# Patient Record
Sex: Female | Born: 1965 | Race: Black or African American | Hispanic: No | Marital: Single | Smoking: Never smoker
Health system: Southern US, Community
[De-identification: ages and names within clinical notes are randomized; demographics above are authoritative.]

## PROBLEM LIST (undated history)

## (undated) DIAGNOSIS — I10 Essential (primary) hypertension: Secondary | ICD-10-CM

## (undated) DIAGNOSIS — Z5189 Encounter for other specified aftercare: Secondary | ICD-10-CM

## (undated) DIAGNOSIS — Z923 Personal history of irradiation: Secondary | ICD-10-CM

## (undated) DIAGNOSIS — R011 Cardiac murmur, unspecified: Secondary | ICD-10-CM

## (undated) DIAGNOSIS — C50919 Malignant neoplasm of unspecified site of unspecified female breast: Secondary | ICD-10-CM

## (undated) HISTORY — DX: Encounter for other specified aftercare: Z51.89

## (undated) HISTORY — DX: Malignant neoplasm of unspecified site of unspecified female breast: C50.919

## (undated) HISTORY — DX: Essential (primary) hypertension: I10

## (undated) HISTORY — PX: PELVIC LAPAROSCOPY: SHX162

## (undated) HISTORY — DX: Cardiac murmur, unspecified: R01.1

## (undated) HISTORY — PX: ABDOMINAL HYSTERECTOMY: SHX81

---

## 1999-06-26 ENCOUNTER — Other Ambulatory Visit: Admission: RE | Admit: 1999-06-26 | Discharge: 1999-06-26 | Payer: Self-pay | Admitting: Obstetrics & Gynecology

## 2000-08-13 ENCOUNTER — Other Ambulatory Visit: Admission: RE | Admit: 2000-08-13 | Discharge: 2000-08-13 | Payer: Self-pay | Admitting: Obstetrics & Gynecology

## 2000-10-01 ENCOUNTER — Encounter (INDEPENDENT_AMBULATORY_CARE_PROVIDER_SITE_OTHER): Payer: Self-pay

## 2000-10-01 ENCOUNTER — Ambulatory Visit (HOSPITAL_COMMUNITY): Admission: RE | Admit: 2000-10-01 | Discharge: 2000-10-01 | Payer: Self-pay | Admitting: Obstetrics & Gynecology

## 2000-11-06 ENCOUNTER — Other Ambulatory Visit: Admission: RE | Admit: 2000-11-06 | Discharge: 2000-11-06 | Payer: Self-pay | Admitting: *Deleted

## 2000-11-12 ENCOUNTER — Encounter: Payer: Self-pay | Admitting: Obstetrics & Gynecology

## 2000-11-12 ENCOUNTER — Ambulatory Visit (HOSPITAL_COMMUNITY): Admission: RE | Admit: 2000-11-12 | Discharge: 2000-11-12 | Payer: Self-pay | Admitting: Obstetrics & Gynecology

## 2002-03-15 ENCOUNTER — Other Ambulatory Visit: Admission: RE | Admit: 2002-03-15 | Discharge: 2002-03-15 | Payer: Self-pay | Admitting: Obstetrics & Gynecology

## 2003-03-15 ENCOUNTER — Other Ambulatory Visit: Admission: RE | Admit: 2003-03-15 | Discharge: 2003-03-15 | Payer: Self-pay | Admitting: Obstetrics & Gynecology

## 2003-06-26 ENCOUNTER — Observation Stay (HOSPITAL_COMMUNITY): Admission: AD | Admit: 2003-06-26 | Discharge: 2003-06-26 | Payer: Self-pay | Admitting: Obstetrics and Gynecology

## 2003-08-28 ENCOUNTER — Encounter (INDEPENDENT_AMBULATORY_CARE_PROVIDER_SITE_OTHER): Payer: Self-pay | Admitting: Specialist

## 2003-08-29 ENCOUNTER — Inpatient Hospital Stay (HOSPITAL_COMMUNITY): Admission: RE | Admit: 2003-08-29 | Discharge: 2003-08-31 | Payer: Self-pay | Admitting: Obstetrics & Gynecology

## 2004-01-26 ENCOUNTER — Ambulatory Visit (HOSPITAL_COMMUNITY): Admission: RE | Admit: 2004-01-26 | Discharge: 2004-01-26 | Payer: Self-pay | Admitting: Infectious Diseases

## 2009-04-04 ENCOUNTER — Ambulatory Visit (HOSPITAL_COMMUNITY): Admission: RE | Admit: 2009-04-04 | Discharge: 2009-04-04 | Payer: Self-pay | Admitting: Obstetrics & Gynecology

## 2010-04-05 ENCOUNTER — Ambulatory Visit (HOSPITAL_COMMUNITY): Admission: RE | Admit: 2010-04-05 | Discharge: 2010-04-05 | Payer: Self-pay | Admitting: Obstetrics & Gynecology

## 2010-10-25 NOTE — Op Note (Signed)
[facility]  Patient:    Erin Bowers, Erin Bowers                          MRN: [id number] Proc. Date: 10/01/00 Adm. Date:  [id number] Attending:  Genia Del                           Operative Report  PREOPERATIVE DIAGNOSES:       1. Menorrhagia.                               2. Secondary anemia.                               3. Intrauterine lesion, possible myoma                                  or polyp.  POSTOPERATIVE DIAGNOSES:      1. Menorrhagia.                               2. Secondary anemia.                               3. Intrauterine lesion, possible myoma                                  or polyp.                               4. Confirmed submucosal and intramural                                  myoma.                               5. Multiple endometrial polyps.  INTERVENTION:                 Hysteroscopy with myomectomy and polypectomy. Also dilatation and curettage.  SURGEON:                      Genia Del, M.D.  ANESTHESIOLOGIST:             Ellison Hughs., M.D.  PROCEDURE:                    Under general anesthesia with endotracheal intubation, the patient is in the lithotomy position.  She is prepped with Betadine in the suprapubic, vulva and vaginal areas.  The bladder is catheterized and the patient is draped as usual.  The vaginal examination under general anesthesia reveals an anteverted uterus, irregular and slightly increased in volume -- corresponding to about [redacted] weeks gestation.  No adnexal mass.  The uterus is mobile.  Cervix is normal to palpation.  The speculum is inserted.  The anterior lip of the cervix is grasped.  We then proceeded to dilate the patient with  Hegar dilators up to #31 easily.  We then inserted the abortive hysteroscope and inspect the intrauterine cavity.  We noticed multiple polyps, especially on the lateral left side of the uterus on the anterior wall and posterior wall.  A  large intramural myoma is present on the anterior right aspect of the uterus, and is bulging into the uterine cavity. A smaller myoma, about 20 cm in diameter, is present on the left posterior wall.  At this point is submucosal.  We used the double loop to extract all the endometrial polyps and the submucosal myoma.  The larger intramural myoma is shaped progressively up to the point where the uterine cavity is regular. The left ostium is well seen.  The right ostium is not as well seen because of this anterior right myoma.  We obtained hemostasis with coagulation with the loop.  Pictures are taken before and after the procedure.  Hemostasis is adequate.  We proceed to curettage with a sharp curet.  Both specimens from myomas and polyps, as well as the endometrial curettage are sent to pathology. We look once more with the hysteroscope, complete hemostasis once more.  It is then completely satisfactory.  The instruments are removed.  ESTIMATED BLOOD LOSS:         50 cc.  FLUID LOSS:                   175 cc.  COMPLICATIONS:                None occurred.  DISPOSITION:                  The patient was transferred to recovery room in good condition. DD:  10/01/00 TD:  10/02/00 Job: [id number]

## 2010-10-25 NOTE — Discharge Summary (Signed)
NAME:  Erin Bowers, Erin Bowers                           ACCOUNT NO.:  [id number]   MEDICAL RECORD NO.:  [id number]                   PATIENT TYPE:  INP   LOCATION:  9319                                 FACILITY:  [facility]   PHYSICIAN:  Genia Del, M.D.             DATE OF BIRTH:  04/12/1966   DATE OF ADMISSION:  08/28/2003  DATE OF DISCHARGE:  08/31/2003                                 DISCHARGE SUMMARY   ADMISSION DIAGNOSES:  Severe menorrhagia with secondary severe anemia with  myomatous uterus.   DISCHARGE DIAGNOSES:  1. Severe menorrhagia with secondary severe anemia with myomatous uterus.  2. Endometriosis.   PATHOLOGY REPORT:  Extensive adenomyosis, subserosal and intramural  leiomyomata.   INTERVENTION:  On August 28, 2003 - opened diagnostic laparoscopy and total  abdominal hysterectomy with cauterization of left ovarian endometrioma.   HOSPITAL COURSE:  The patient had surgery on August 28, 2003.  No  complications occurred.  Her postoperative course was unremarkable.  She  remained afebrile and hemodynamically stable.  Her hemoglobin went from 10.5  preoperatively in a slightly dehydrated state to 8.6 postoperatively.  She  was discharged on postoperative day #3 - August 31, 2003 - in good status.  Postoperative advices were given.  The patient was prescribed iron sulfate  b.i.d. for 2 months, Tylox p.r.n. for pain, and she will follow up at  [facility] office in 4 weeks.                                               Genia Del, M.D.    ML/MEDQ  D:  09/26/2003  T:  09/27/2003  Job:  [id number]

## 2010-10-25 NOTE — H&P (Signed)
NAME:  Erin Bowers, Erin Bowers                           ACCOUNT NO.:  [id number]   MEDICAL RECORD NO.:  [id number]                   PATIENT TYPE:  AMB   LOCATION:  SDC                                  [facility]ILITY[facility]  [facility]   PHYSICIAN:  Genia Del, M.D.             DATE OF BIRTH:  [birth year]   DATE OF ADMISSION:  08/28/2003  DATE OF DISCHARGE:                                HISTORY & PHYSICAL   Ms. Erin Bowers is a 45 year old G0.   REASON FOR ADMISSION:  Severe menorrhagia with anemia requiring blood  transfusion, and myomatous uterus.   HISTORY OF PRESENT ILLNESS:  The patient had severe long-standing severe  menorrhagia with secondary anemia.  She has attempted pregnancy without  success in the past but now she does not want to preserve her fertility.  She had a hysteroscopy with myomectomy and polypectomy with dilation and  curettage by myself on October 01, 2000.  The pathology at that time was  benign, showing secretory endometrium, benign polyp, and submucosal  leiomyoma.  Since that surgery her bleeding has been worsening progressively  to the point where her hemoglobin was at 5.5 on June 26, 2003, at which  time she went for two units of packed red blood cells transfusion.  She was  also started on Lupron Depot at that time.  It took until past the second  injection before the bleeding stopped.  She was also using Aygestin during  that period.  She has received a third dose of Lupron Depot on August 11, 2003, and is currently not bleeding and has just slight cramping.  Her last  hemoglobin on August 22, 2003, was at 10.   PAST MEDICAL HISTORY:  Otherwise negative.   PAST SURGICAL HISTORY:  Left salpingectomy, right tuboplasty by laparotomy,  1997 and 1998.  Diagnostic laparoscopy just before those surgeries.   No known drug allergies.   CURRENT MEDICATION:  Iron supplement.   SOCIAL HISTORY:  Nonsmoker.  Married, currently separating.   REVIEW OF SYSTEMS:  CONSTITUTIONAL:   Negative.  HEENT:  Negative.  CARDIOVASCULAR:  Negative. RESPIRATORY:  Negative.  GASTROINTESTINAL:  Negative.  UROLOGIC:  Negative.  NEUROLOGIC:  Negative.  ENDOCRINOLOGIC:  Negative.  DERMATOLOGIC:  Negative.   PHYSICAL EXAMINATION:  GENERAL:  No apparent distress.  VITAL SIGNS:  Blood pressure 114/70, pulse regular, respiratory rate 20,  temperature normal.  Weight 155.4 pounds.  CHEST:  Lungs clear bilaterally.  CARDIAC:  Regular cardiac rhythm, no murmur.  ABDOMEN:  Soft, nontender.  PELVIC:  Vaginal exam:  Uterus anteverted, about 10 cm, irregular, mobile.  No adnexal mass.  Cervix nontender to mobilization.  No bleeding.  Last Pap  test October 2004 within normal limits.  EXTREMITIES:  Lower limbs normal, good pulses.   Hemoglobin on August 22, 2003, increased to 10.   IMPRESSION:  Severe longstanding menorrhagia with severe anemia requiring  blood transfusion in January 2005,  on a myomatous uterus, which responded to  Lupron Depot x3 injections.  The patient does not want to preserve her  conception potential.   PLAN:  Admit to [facility] for a laparoscopy-assisted vaginal  hysterectomy, possible laparotomy, preservation of ovaries.  Surgery and  risks reviewed with the patient, including infection, hemorrhage up to the  point of requiring blood transfusion, trauma to bowels, blood vessels,  ureters, bladder, DVT, pulmonary embolism, and anesthesiology complications.  Prep for bowels prescribed.  Will give a dose of Ancef at induction.  The  patient agrees with plan and voiced understanding.                                               Genia Del, M.D.    ML/MEDQ  D:  08/27/2003  T:  08/28/2003  Job:  [id number]

## 2010-10-25 NOTE — Op Note (Signed)
NAME:  Erin Bowers, FRIESENHAHN                           ACCOUNT NO.:  [id number]   MEDICAL RECORD NO.:  [id number]                   PATIENT TYPE:  OBV   LOCATION:  9319                                 [facility]ACILITY:  [facility]   PHYSICIAN:  Genia Del, M.D.             DATE OF BIRTH:  09-01-1965   DATE OF PROCEDURE:  08/28/2003  DATE OF DISCHARGE:                                 OPERATIVE REPORT   PREOPERATIVE DIAGNOSIS:  Severe menorrhagia with secondary severe anemia and  myomatous uterus.   POSTOPERATIVE DIAGNOSES:  1. Severe menorrhagia with secondary severe anemia and myomatous uterus.  2. Mild pelvic endometriosis.  3. Uterine size corresponding to 14-15 weeks' size.   INTERVENTION:  1. Open diagnostic laparoscopy.  2. Total abdominal hysterectomy.  3. Cauterization of left endometrioma.   SURGEON:  Genia Del, M.D.   ASSISTANT:  Maxie Better, M.D.   ANESTHESIOLOGIST:  Burnett Corrente, M.D.   PROCEDURE:  Under general anesthesia with endotracheal intubation, the  patient is in lithotomy position for operative laparoscopy.  She is prepped  with Hibiclens on the abdominal, suprapubic, vulvar, and vaginal area.  The  bladder catheter is inserted and the patient is draped as usual.  The  vaginal exam reveals a uterus estimated to 12-13 weeks' size, mobile, no  adnexal mass.  The speculum is inserted.  The uterus is cannulated, and the  speculum is removed.  We then go to abdominal time.  An infraumbilical  incision is made with the scalpel over 2 cm.  We then open layer by layer  with Mayo scissors.  The intra-abdominal cavity is entered under direct  vision.  We put a suture of Vicryl 0 at the aponeurosis in a pursestring  fashion.  We then insert the trocar with the laparoscope at that level.  The  abdominal cavity is inspected, a picture is taken of the appendix, which  appears normal.  The liver appears normal.  No pathology is seen in the  abdomen.  In the pelvic  cavity the uterus is taking all the pelvic area and  coming up above the pelvic brim.  Visualization of the right ovary is not  possible.  We put a second trocar in the suprapubic area at the level of her  previous scar.  We make an incision over 5 mm with a scalpel, insert a 5 mm  trocar and an atraumatic clamp at that level.  Mobilization of the uterus to  access either side of the uterus is not possible.  The uterus is too  voluminous to proceed with LAVH.  The decision is therefore taken to remove  the instrument, evacuate the CO2, and proceed with total abdominal  hysterectomy via a Pfannenstiel incision at the site of the previous scar.  We attach the suture at the aponeurosis at the infraumbilical area.  We then  make a Pfannenstiel incision at the site of the  previous scar with the  scalpel.  Infiltration of the subcutaneous tissue was done at the  infraumbilical incision as well as at the Pfannenstiel incision with  Marcaine 0.25% plain, a total of about 15 mL.  We open the adipose tissue.  We then open the aponeurosis with the scalpel and then with the Mayo  scissors transversely.  We separate the recti muscles from the aponeurosis  on the midline superiorly and inferiorly.  The parietal peritoneum is opened  longitudinally with Metzenbaum scissors.  We then retract the bladder  downward.  We are able to bring the uterus outside the abdominal cavity and  can start the surgery in that position.  We use retractors at this  __________.  We suture the right round ligament with a Vicryl 0.  We  cauterize and cut with the electrocautery.  We then open the parietal  peritoneum anteriorly with Metzenbaum scissors and retract the bladder  downward.  We proceed the same way on the left side, suturing the left round  ligament with a Vicryl 0, cauterizing and cutting with the electrocautery,  and then finishing the opening of the visceral peritoneum anteriorly.  We  bring the bladder even  lower down at the junction of the cervix and vagina.  We then make a window in the broad ligament on the right side.  We clamp the  tube and the utero-ovarian ligament.  We section with Mayo scissors and  suture with a 0 Vicryl stitch and then a free tie.  We proceed the same way  on the left side.  We then attempt to visualize the ureters, but it is not  possible with the uterus in place.  We can feel them in normal anatomic  location.  We then continue clamping the right uterine artery, sectioning  and securing with a 0 Vicryl.  The suture is doubled.  We proceed the same  way on the left side.  We then reach the uterosacral ligaments.  They are  taken separately with a curved Heaney, clamped, cut with Mayo scissors, and  sutured with a 0 Vicryl that is kept on a hemostat on each side.  We then  continue down the uterus, reaching the angle of the vagina.  A curved Heaney  is used on either side at that level.  We clamp, cut, with Mayo scissors,  and suture with a Vicryl 0 in a Heaney stitch that is kept on a straight  hemostat on each side.  The uterus is removed in one piece and sent to  pathology.  The cervix is intact.  We then complete the closure of the  vagina with one stitch in the midline.  An X stitch is done with Vicryl  0.  We then proceed with suspension.  The uterosacral ligament is attached to  the angle of the vagina on its respective side and an additional stitch is  used to secure that attachment on each side.  We verify hemostasis, which is  adequate at all levels, at all pedicles.  The ureters are visualized and  moving on each side.  The urine is clear, although minimal amounts are  produced probably due to relative dehydration of the patient, who mentioned  before the surgery that she was fasting over the weekend.  We had put a  retractor with three laps after removing the uterus.  Those are all removed after irrigation and suction of the abdominopelvic cavity.  We  reposition  the bowels with  the omentum properly.  We then verify hemostasis on the  recti muscles and aponeurosis.  It is completed with the electrocautery.  We  then close the aponeurosis with two half running sutures of Vicryl 0.  We  then verify hemostasis on the adipose tissue and complete it with the  electrocautery.  We then reapproximate the skin with staples.  The incision  at the infraumbilical area is closed at the skin with a 4-0 Monocryl.  The  count of sponges and instruments was complete x2.  The estimated blood loss  was 250 mL.  No complication occurred, and the patient was transferred to  the recovery room in good status.  Note that she received Ancef 1 g IV  before the procedure and had PAS device during the surgery.                                               Genia Del, M.D.    ML/MEDQ  D:  08/28/2003  T:  08/29/2003  Job:  [id number]

## 2011-03-12 ENCOUNTER — Other Ambulatory Visit (HOSPITAL_COMMUNITY): Payer: Self-pay | Admitting: Obstetrics & Gynecology

## 2011-03-12 DIAGNOSIS — Z1231 Encounter for screening mammogram for malignant neoplasm of breast: Secondary | ICD-10-CM

## 2011-04-14 ENCOUNTER — Ambulatory Visit (HOSPITAL_COMMUNITY): Payer: Self-pay

## 2011-04-15 ENCOUNTER — Ambulatory Visit (HOSPITAL_COMMUNITY)
Admission: RE | Admit: 2011-04-15 | Discharge: 2011-04-15 | Disposition: A | Discharge status: Home / Self Care | Payer: 59 | Source: Ambulatory Visit | Attending: Obstetrics & Gynecology | Admitting: Obstetrics & Gynecology

## 2011-04-15 DIAGNOSIS — Z1231 Encounter for screening mammogram for malignant neoplasm of breast: Secondary | ICD-10-CM | POA: Insufficient documentation

## 2012-04-07 ENCOUNTER — Other Ambulatory Visit (HOSPITAL_COMMUNITY): Payer: Self-pay | Admitting: Obstetrics & Gynecology

## 2012-04-07 DIAGNOSIS — Z1231 Encounter for screening mammogram for malignant neoplasm of breast: Secondary | ICD-10-CM

## 2012-04-23 ENCOUNTER — Ambulatory Visit (HOSPITAL_COMMUNITY)
Admission: RE | Admit: 2012-04-23 | Discharge: 2012-04-23 | Disposition: A | Discharge status: Home / Self Care | Payer: 59 | Source: Ambulatory Visit | Attending: Obstetrics & Gynecology | Admitting: Obstetrics & Gynecology

## 2012-04-23 DIAGNOSIS — Z1231 Encounter for screening mammogram for malignant neoplasm of breast: Secondary | ICD-10-CM | POA: Insufficient documentation

## 2013-04-21 ENCOUNTER — Other Ambulatory Visit (HOSPITAL_COMMUNITY): Payer: Self-pay | Admitting: Obstetrics & Gynecology

## 2013-04-21 DIAGNOSIS — Z1231 Encounter for screening mammogram for malignant neoplasm of breast: Secondary | ICD-10-CM

## 2013-05-10 ENCOUNTER — Ambulatory Visit (HOSPITAL_COMMUNITY)
Admission: RE | Admit: 2013-05-10 | Discharge: 2013-05-10 | Disposition: A | Discharge status: Home / Self Care | Payer: 59 | Source: Ambulatory Visit | Attending: Obstetrics & Gynecology | Admitting: Obstetrics & Gynecology

## 2013-05-10 DIAGNOSIS — Z1231 Encounter for screening mammogram for malignant neoplasm of breast: Secondary | ICD-10-CM | POA: Insufficient documentation

## 2015-03-29 ENCOUNTER — Other Ambulatory Visit: Payer: Self-pay

## 2015-03-29 DIAGNOSIS — Z1231 Encounter for screening mammogram for malignant neoplasm of breast: Secondary | ICD-10-CM

## 2015-04-27 ENCOUNTER — Ambulatory Visit
Admission: RE | Admit: 2015-04-27 | Discharge: 2015-04-27 | Disposition: A | Discharge status: Home / Self Care | Payer: 59 | Source: Ambulatory Visit

## 2015-04-27 DIAGNOSIS — Z1231 Encounter for screening mammogram for malignant neoplasm of breast: Secondary | ICD-10-CM

## 2015-05-01 ENCOUNTER — Other Ambulatory Visit: Payer: Self-pay | Admitting: Family

## 2015-05-01 DIAGNOSIS — R928 Other abnormal and inconclusive findings on diagnostic imaging of breast: Secondary | ICD-10-CM

## 2015-05-10 ENCOUNTER — Other Ambulatory Visit: Payer: Self-pay | Admitting: *Deleted

## 2015-05-10 ENCOUNTER — Other Ambulatory Visit: Payer: Self-pay | Admitting: Family

## 2015-05-10 ENCOUNTER — Other Ambulatory Visit: Payer: Self-pay

## 2015-05-10 DIAGNOSIS — R928 Other abnormal and inconclusive findings on diagnostic imaging of breast: Secondary | ICD-10-CM

## 2015-05-31 ENCOUNTER — Ambulatory Visit
Admission: RE | Admit: 2015-05-31 | Discharge: 2015-05-31 | Disposition: A | Discharge status: Home / Self Care | Payer: 59 | Source: Ambulatory Visit | Attending: Family | Admitting: Family

## 2015-05-31 DIAGNOSIS — R928 Other abnormal and inconclusive findings on diagnostic imaging of breast: Secondary | ICD-10-CM

## 2015-06-10 DIAGNOSIS — Z923 Personal history of irradiation: Secondary | ICD-10-CM

## 2015-06-10 HISTORY — PX: BREAST LUMPECTOMY: SHX2

## 2015-06-10 HISTORY — DX: Personal history of irradiation: Z92.3

## 2015-06-13 MED FILL — OLMESARTAN-HCTZ 20-12.5 MG: 20-12.5 | 30 days supply | Qty: 30 | Fill #1

## 2015-06-25 ENCOUNTER — Ambulatory Visit
Admission: RE | Admit: 2015-06-25 | Discharge: 2015-06-25 | Disposition: A | Discharge status: Home / Self Care | Payer: 59 | Source: Ambulatory Visit | Attending: Family | Admitting: Family

## 2015-06-25 ENCOUNTER — Other Ambulatory Visit: Payer: Self-pay | Admitting: Family

## 2015-06-25 DIAGNOSIS — R928 Other abnormal and inconclusive findings on diagnostic imaging of breast: Secondary | ICD-10-CM

## 2015-06-25 DIAGNOSIS — D0512 Intraductal carcinoma in situ of left breast: Secondary | ICD-10-CM | POA: Diagnosis not present

## 2015-06-25 DIAGNOSIS — R921 Mammographic calcification found on diagnostic imaging of breast: Secondary | ICD-10-CM | POA: Diagnosis not present

## 2015-06-26 ENCOUNTER — Ambulatory Visit
Admission: RE | Admit: 2015-06-26 | Discharge: 2015-06-26 | Disposition: A | Discharge status: Home / Self Care | Payer: 59 | Source: Ambulatory Visit | Attending: Family | Admitting: Family

## 2015-06-26 DIAGNOSIS — R928 Other abnormal and inconclusive findings on diagnostic imaging of breast: Secondary | ICD-10-CM

## 2015-06-26 DIAGNOSIS — D0512 Intraductal carcinoma in situ of left breast: Secondary | ICD-10-CM | POA: Diagnosis not present

## 2015-06-27 ENCOUNTER — Telehealth: Payer: Self-pay | Admitting: *Deleted

## 2015-06-27 DIAGNOSIS — C50212 Malignant neoplasm of upper-inner quadrant of left female breast: Secondary | ICD-10-CM | POA: Insufficient documentation

## 2015-06-27 NOTE — Telephone Encounter (Signed)
Left message for a return phone call to schedule for [facility]. 

## 2015-06-27 NOTE — Telephone Encounter (Signed)
Received call back from patient.Confirmed [facility] for 07/04/15 at 830am .  Instructions and contact information given.

## 2015-07-04 ENCOUNTER — Ambulatory Visit: Payer: 59 | Admitting: Physical Therapy

## 2015-07-04 ENCOUNTER — Ambulatory Visit (HOSPITAL_BASED_OUTPATIENT_CLINIC_OR_DEPARTMENT_OTHER): Payer: 59 | Admitting: Hematology and Oncology

## 2015-07-04 ENCOUNTER — Other Ambulatory Visit: Payer: Self-pay | Admitting: General Surgery

## 2015-07-04 ENCOUNTER — Encounter: Payer: Self-pay | Admitting: Hematology and Oncology

## 2015-07-04 ENCOUNTER — Encounter: Payer: Self-pay | Admitting: Skilled Nursing Facility1

## 2015-07-04 ENCOUNTER — Other Ambulatory Visit (HOSPITAL_BASED_OUTPATIENT_CLINIC_OR_DEPARTMENT_OTHER): Payer: 59

## 2015-07-04 ENCOUNTER — Encounter: Payer: Self-pay | Admitting: Nurse Practitioner

## 2015-07-04 ENCOUNTER — Ambulatory Visit
Admission: RE | Admit: 2015-07-04 | Discharge: 2015-07-04 | Disposition: A | Discharge status: Home / Self Care | Payer: 59 | Source: Ambulatory Visit | Attending: Radiation Oncology | Admitting: Radiation Oncology

## 2015-07-04 VITALS — BP 130/86 | HR 73 | Temp 98.3°F | Resp 18 | Ht 64.0 in | Wt 183.1 lb

## 2015-07-04 DIAGNOSIS — D0512 Intraductal carcinoma in situ of left breast: Secondary | ICD-10-CM | POA: Diagnosis not present

## 2015-07-04 DIAGNOSIS — C50212 Malignant neoplasm of upper-inner quadrant of left female breast: Secondary | ICD-10-CM

## 2015-07-04 LAB — COMPREHENSIVE METABOLIC PANEL
ALBUMIN: 4.2 g/dL (ref 3.5–5.0)
ALK PHOS: 73 U/L (ref 40–150)
ALT: 20 U/L (ref 0–55)
ANION GAP: 9 meq/L (ref 3–11)
AST: 19 U/L (ref 5–34)
BILIRUBIN TOTAL: 0.33 mg/dL (ref 0.20–1.20)
BUN: 15.9 mg/dL (ref 7.0–26.0)
CO2: 30 meq/L — AB (ref 22–29)
CREATININE: 0.8 mg/dL (ref 0.6–1.1)
Calcium: 10.1 mg/dL (ref 8.4–10.4)
Chloride: 104 mEq/L (ref 98–109)
EGFR: >90 mL/min/{1.73_m2} (ref >90)
GLUCOSE: 76 mg/dL (ref 70–140)
Potassium: 3.7 mEq/L (ref 3.5–5.1)
SODIUM: 143 meq/L (ref 136–145)
TOTAL PROTEIN: 8 g/dL (ref 6.4–8.3)

## 2015-07-04 LAB — CBC WITH DIFFERENTIAL/PLATELET
BASO%: 0.5 % (ref 0.0–2.0)
Basophils Absolute: 0 10*3/uL (ref 0.0–0.1)
EOS ABS: 0.1 10*3/uL (ref 0.0–0.5)
EOS%: 1.6 % (ref 0.0–7.0)
HCT: 38.3 % (ref 34.8–46.6)
HEMOGLOBIN: 12.8 g/dL (ref 11.6–15.9)
LYMPH%: 45.3 % (ref 14.0–49.7)
MCH: 27 pg (ref 25.1–34.0)
MCHC: 33.3 g/dL (ref 31.5–36.0)
MCV: 81.2 fL (ref 79.5–101.0)
MONO#: 0.5 10*3/uL (ref 0.1–0.9)
MONO%: 8.3 % (ref 0.0–14.0)
NEUT%: 44.3 % (ref 38.4–76.8)
NEUTROS ABS: 2.5 10*3/uL (ref 1.5–6.5)
PLATELETS: 289 10*3/uL (ref 145–400)
RBC: 4.72 10*6/uL (ref 3.70–5.45)
RDW: 13.9 % (ref 11.2–14.5)
WBC: 5.6 10*3/uL (ref 3.9–10.3)
lymph#: 2.6 10*3/uL (ref 0.9–3.3)

## 2015-07-04 NOTE — Progress Notes (Signed)
Subjective:     Patient ID: Erin Bowers, female   DOB: April 05, 1966, 50 y.o.   MRN: [id number]  HPI   Review of Systems     Objective:   Physical Exam For the patient to understand and be given the tools to implement a healthy plant based diet during their cancer diagnosis.     Assessment:     Patient was seen today and found to be a little downtrodden and alone. Pt was quiet with minimal questions. Pt seemed relieved when dietitian spoke on a simplified way of thinking of food/nutrition. Pts ht unknown, wt 183 pounds. Pts medications multivitamin and b vitamins.     Plan:     Dietitian educated the patient on implementing a plant based diet by incorporating more plant proteins, fruits, and vegetables. As a part of a healthy routine physical activity was discussed. The importance of legitimate, evidence based information was discussed and examples were given. A folder of evidence based information with a focus on a plant based diet and general nutrition during cancer was given to the patient.  As a part of the continuum of care the cancer dietitian's contact information was given to the patient in the event they would like to have a follow up appointment.

## 2015-07-04 NOTE — Assessment & Plan Note (Signed)
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 It is considered a precancerous lesion. DCIS is classified as a 0. It is generally detected through mammograms as calcifications. We discussed the significance of grades and its impact on prognosis. We also discussed the importance of ER and PR receptors and their implications to adjuvant treatment options. Prognosis of DCIS dependence on grade, comedo necrosis. It is anticipated that if not treated, 20-30% of DCIS can develop into invasive breast cancer.  Recommendation: 1. Breast conserving surgery 2. Followed by adjuvant radiation therapy (May need post op Mammogram) 3. Followed by antiestrogen therapy with tamoxifen 5 years  Tamoxifen counseling: We discussed the risks and benefits of tamoxifen. These include but not limited to insomnia, hot flashes, mood changes, vaginal dryness, and weight gain. Although rare, serious side effects including endometrial cancer, risk of blood clots were also discussed. We strongly believe that the benefits far outweigh the risks. Patient understands these risks and consented to starting treatment. Planned treatment duration is 5 years.  Return to clinic after surgery to discuss the final pathology report and come up with an adjuvant treatment plan.

## 2015-07-04 NOTE — Progress Notes (Signed)
Erin Bowers is a very pleasant 50 y.o. 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  Biopsy results revealed the tumor's prognostic profile is ER positive and PR positive.  She presents today to the [facility]) for treatment consideration and recommendations from the breast surgeon, radiation oncologist, and medical oncologist.     I briefly met with Erin Bowers during her [facility] visit today. We discussed the purpose of the Survivorship Clinic, which will include monitoring for recurrence, coordinating completion of age and gender-appropriate cancer screenings, promotion of overall wellness, as well as managing potential late/long-term side effects of anti-cancer treatments.    The treatment plan for Erin Bowers will likely include surgery, radiation therapy, and anti-estrogen therapy.  As of today, the intent of treatment for Erin Bowers is cure, therefore she will be eligible for the Survivorship Clinic upon her completion of treatment.  Her survivorship care plan (SCP) document will be drafted and updated throughout the course of her treatment trajectory. She will receive the SCP in an office visit with myself in the Survivorship Clinic once she has completed treatment.   Erin Bowers was encouraged to ask questions and all questions were answered to her satisfaction.  She was given my business card and encouraged to contact me with any concerns regarding survivorship.  I look forward to participating in her care.   Kenn File, [place] [phone]

## 2015-07-04 NOTE — Progress Notes (Signed)
Radiation Oncology         [phone]) [phone] ________________________________  Initial outpatient Consultation - Date: 07/04/2015   Name: Erin Bowers MRN: [id number]   DOB: 1966-04-23  REFERRING PHYSICIAN: Autumn Messing III, MD  DIAGNOSIS AND STAGE: No matching staging information was found for the patient. Stage 0 left breast DCIS, ER+, PR+   HISTORY OF PRESENT ILLNESS::Erin Bowers is a 50 y.o. female  who presented for routine screening mammogram on 04/27/15 and was found to have abnormal calcifications in the upper inner central left breast. Follow up mammogram on 05/31/15 showed a 4.4x2.3x3.3 cm group of suspicious linear branching micro-calcifications in the posterior medial left breast at the 9 o'clock position. Biopsy on 06/25/15 revealed high grade DCIS and necrosis, ER+, PR+.   Erin Bowers is here alone today. She has no personal history of cancer. She is concerned about skin darkening. She works first shift at [employer].   PREVIOUS RADIATION THERAPY: No  Past medical, social and family history were reviewed in the electronic chart. Review of symptoms was reviewed in the electronic chart. Medications were reviewed in the electronic chart.   Gynecologic History  Age at first menstrual period? 13  Are you still having periods? No Approximate date of last period? 2005  If you are still having periods: Are your periods regular? No  If you no longer have periods: Have you used hormone replacement? No  If YES, for how long? N/A When did you stop? N/A Obstetric History:  How many children have you carried to term? 0 Your age at first live birth? N/A  Pregnant now or trying to get pregnant? No  Have you used birth control pills or hormone shots for contraception? No  If so, for how long (or approximate dates)? N/A  Would you be interested in learning more about the options to preserve fertility? N/A Health Maintenance:  Have you ever had a colonoscopy? No If yes, date? N/A  Have  you ever had a bone density? No If yes, date? N/A  Date of your last PAP smear? April 27, 2015 Date of your FIRST mammogram? At age 63  PHYSICAL EXAM:  Vitals with BMI 07/04/2015  Weight 932 lbs 2 oz  Systolic 671  Diastolic 86  Pulse 73  Respirations 18   General: Alert and oriented, in no acute distress HEENT: Head is normocephalic.  Neck: Neck is supple, no palpable cervical or supraclavicular lymphadenopathy. Breast: Left breast shows bruising associated with biopsy change in the upper inner quadrant. No palpable abnormalities of the right breast. No axillary adenopathy bilaterally.   Skin: No concerning lesions.  IMPRESSION: Stage 0 left breast DCIS, ER+, PR+  PLAN: I spoke to the patient today regarding her diagnosis and options for treatment. We discussed the equivalence in terms of survival and local failure between mastectomy and breast conservation. We discussed the role of radiation in decreasing local failures in patients who undergo lumpectomy. We discussed the process of simulation and the placement tattoos. We discussed 4-6 weeks of treatment as an outpatient. We discussed the possibility of asymptomatic lung damage. We discussed the low likelihood of secondary malignancies. We discussed the possible side effects including but not limited to skin redness, fatigue, permanent skin darkening, and breast swelling.  We discussed the use of cardiac sparing with deep inspiration breath hold if needed. She is not interested in sentinel node biopsy at this time and spoke with Dr. Marlou Starks III about this. She met with our dietician  and physical therapist today.   I spent 40 minutes  face to face with the patient and more than 50% of that time was spent in counseling and/or coordination of care.   ------------------------------------------------  Thea Silversmith, MD  This document serves as a record of services personally performed by Thea Silversmith, MD. It was created on her behalf by  Arlyce Harman, a trained medical scribe. The creation of this record is based on the scribe's personal observations and the provider's statements to them. This document has been checked and approved by the attending provider.

## 2015-07-04 NOTE — Progress Notes (Signed)
[place] NOTE  Patient Care Team: Nanci Pina, FNP as PCP - General (Nurse Practitioner) Autumn Messing III, MD as Consulting Physician (General Surgery) Nicholas Lose, MD as Consulting Physician (Hematology and Oncology) Thea Silversmith, MD as Consulting Physician (Radiation Oncology) Sylvan Cheese, NP as Nurse Practitioner (Hematology and Oncology)  CHIEF COMPLAINTS/PURPOSE OF CONSULTATION:  Newly diagnosed breast cancer  HISTORY OF PRESENTING ILLNESS:  Erin Bowers 50 y.o. female is here because of recent diagnosis of left breast cancer. She had a screening mammogram detected left breast calcifications measuring 4.4 cm. 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 She was presented this morning in the multidisciplinary tumor board and she is here today to discuss a treatment plan.   I reviewed her records extensively and collaborated the history with the patient.  SUMMARY OF ONCOLOGIC HISTORY:   Breast cancer of upper-inner quadrant of left female breast ([place])   06/25/2015 Mammogram Screening detected left breast calcifications posterior depth 4.4 x 3.3 x 2.3 cm   06/27/2015 Initial Diagnosis Left breast biopsy upper quadrant: DCIS with comedo necrosis and calcification, grade 2-3, ER 100%, PR 15%, Tis N0 stage 0 clinical stage    In terms of breast cancer risk profile:  She menarched at early age of 49  She had no pregnancies She has not received birth control pills She was never exposed to fertility medications or hormone replacement therapy.  She has no family history of Breast/GYN/GI cancer  MEDICAL HISTORY:  Past Medical History  Diagnosis Date  . Breast cancer ([place])   . Hypertension     SURGICAL HISTORY: Past Surgical History  Procedure Laterality Date  . Abdominal hysterectomy      SOCIAL HISTORY: Social History   Social History  . Marital  Status: Single    Spouse Name: N/A  . Number of Children: N/A  . Years of Education: N/A   Occupational History  . Not on file.   Social History Main Topics  . Smoking status: Never Smoker   . Smokeless tobacco: Not on file  . Alcohol Use: No  . Drug Use: No  . Sexual Activity: Not on file   Other Topics Concern  . Not on file   Social History Narrative  . No narrative on file    FAMILY HISTORY: History reviewed. No pertinent family history.  ALLERGIES:  has no allergies on file.  MEDICATIONS:  Current Outpatient Prescriptions  Medication Sig Dispense Refill  . Ascorbic Acid (VITAMIN C) 1000 MG tablet Take 1,000 mg by mouth daily.    . Cholecalciferol (D3-1000) 1000 units capsule Take 1,000 Units by mouth daily.    . Multiple Vitamin (MULTIVITAMIN) tablet Take 1 tablet by mouth daily.    Marland Kitchen pyridOXINE (VITAMIN B-6) 100 MG tablet Take 100 mg by mouth daily.    Marland Kitchen UNABLE TO FIND Take 500 mg by mouth daily. Mega Red    . vitamin B-12 (CYANOCOBALAMIN) 1000 MCG tablet Take 1,000 mcg by mouth daily.    Marland Kitchen olmesartan-hydrochlorothiazide (BENICAR HCT) 20-12.5 MG tablet   3   No current facility-administered medications for this visit.    REVIEW OF SYSTEMS:   Constitutional: Denies fevers, chills or abnormal night sweats Eyes: Denies blurriness of vision, double vision or watery eyes Ears, nose, mouth, throat, and face: Denies mucositis or sore throat Respiratory: Denies cough, dyspnea or wheezes Cardiovascular: Denies palpitation, chest discomfort  or lower extremity swelling Gastrointestinal:  Denies nausea, heartburn or change in bowel habits Skin: Denies abnormal skin rashes Lymphatics: Denies new lymphadenopathy or easy bruising Neurological:Denies numbness, tingling or new weaknesses Behavioral/Psych: Mood is stable, no new changes  Breast:  Denies any palpable lumps or discharge All other systems were reviewed with the patient and are negative.  PHYSICAL  EXAMINATION: ECOG PERFORMANCE STATUS: 0 - Asymptomatic  Filed Vitals:   07/04/15 0925  BP: 130/86  Pulse: 73  Temp: 98.3 F (36.8 C)  Resp: 18   Filed Weights   07/04/15 0925  Weight: 183 lb 1.6 oz (83.054 kg)    GENERAL:alert, no distress and comfortable SKIN: skin color, texture, turgor are normal, no rashes or significant lesions EYES: normal, conjunctiva are pink and non-injected, sclera clear OROPHARYNX:no exudate, no erythema and lips, buccal mucosa, and tongue normal  NECK: supple, thyroid normal size, non-tender, without nodularity LYMPH:  no palpable lymphadenopathy in the cervical, axillary or inguinal LUNGS: clear to auscultation and percussion with normal breathing effort HEART: regular rate & rhythm and no murmurs and no lower extremity edema ABDOMEN:abdomen soft, non-tender and normal bowel sounds Musculoskeletal:no cyanosis of digits and no clubbing  PSYCH: alert & oriented x 3 with fluent speech NEURO: no focal motor/sensory deficits BREAST: No palpable nodules in breast. No palpable axillary or supraclavicular lymphadenopathy (exam performed in the presence of a chaperone)   LABORATORY DATA:  I have reviewed the data as listed Lab Results  Component Value Date   WBC 5.6 07/04/2015   HGB 12.8 07/04/2015   HCT 38.3 07/04/2015   MCV 81.2 07/04/2015   PLT 289 07/04/2015   Lab Results  Component Value Date   NA 143 07/04/2015   K 3.7 07/04/2015   CO2 30* 07/04/2015   ASSESSMENT AND PLAN:  Breast cancer of upper-inner quadrant of left female breast ([facility]) Left breast biopsy upper outer quadrant 06/25/2015: DCIS with comedo necrosis and calcification, grade 2-3, ER 100%, PR 15%, Screening detected left breast calcifications posterior depth 4.4 x 3.3 x 2.3 cm Tis N0 stage 0 clinical stage  Pathology review: I discussed with the patient the difference between DCIS and invasive breast cancer. It is considered a precancerous lesion. DCIS is classified as a 0.  It is generally detected through mammograms as calcifications. We discussed the significance of grades and its impact on prognosis. We also discussed the importance of ER and PR receptors and their implications to adjuvant treatment options. Prognosis of DCIS dependence on grade, comedo necrosis. It is anticipated that if not treated, 20-30% of DCIS can develop into invasive breast cancer.  Recommendation: 1. Breast conserving surgery 2. Followed by adjuvant radiation therapy (May need post op Mammogram) 3. Followed by antiestrogen therapy with tamoxifen 5 years  Tamoxifen counseling: We discussed the risks and benefits of tamoxifen. These include but not limited to insomnia, hot flashes, mood changes, vaginal dryness, and weight gain. Although rare, serious side effects including endometrial cancer, risk of blood clots were also discussed. We strongly believe that the benefits far outweigh the risks. Patient understands these risks and consented to starting treatment. Planned treatment duration is 5 years.  Return to clinic after surgery to discuss the final pathology report and come up with an adjuvant treatment plan.  All questions were answered. The patient knows to call the clinic with any problems, questions or concerns.    Rulon Eisenmenger, MD 07/04/2015

## 2015-07-09 ENCOUNTER — Other Ambulatory Visit: Payer: Self-pay | Admitting: General Surgery

## 2015-07-09 ENCOUNTER — Encounter: Payer: Self-pay | Admitting: *Deleted

## 2015-07-09 ENCOUNTER — Telehealth: Payer: Self-pay | Admitting: *Deleted

## 2015-07-09 DIAGNOSIS — D0512 Intraductal carcinoma in situ of left breast: Secondary | ICD-10-CM

## 2015-07-09 NOTE — Progress Notes (Signed)
Clinical Social Work Erin Bowers Psychosocial Distress Screening [place]  Patient completed distress screening protocol and scored a 5 on the Psychosocial Distress Thermometer which indicates moderate distress. Clinical Social Worker met with patient in [facility] on 07/04/15 to assess for distress and other psychosocial needs. Patient stated she was feeling overwhelmed but felt "better" after meeting with the treatment team and getting more information on her treatment plan. CSW and patient discussed common feeling and emotions when being diagnosed with cancer, and the importance of support during treatment. CSW informed patient of the support team and support services at [facility] and patient was agreeable to an [employer]. CSW provided contact information and encouraged patient to call with any questions or concerns.  ONCBCN DISTRESS SCREENING 07/09/2015  Screening Type Initial Screening  Distress experienced in past week (1-10) 5  Emotional problem type Nervousness/Anxiety  Physician notified of physical symptoms Yes  Referral to clinical psychology No  Referral to clinical social work Yes  Referral to dietition No  Referral to financial advocate No  Referral to support programs Yes  Referral to palliative care No   Johnnye Lana, MSW, LCSW, OSW-C Clinical Social Worker [place] [phone]

## 2015-07-09 NOTE — Telephone Encounter (Signed)
Left vm for pt to return call regarding bmdc from 1/25. Contact information provided.

## 2015-07-12 ENCOUNTER — Other Ambulatory Visit: Payer: Self-pay | Admitting: *Deleted

## 2015-07-12 MED FILL — OLMESARTAN-HCTZ 20-12.5 MG: 20-12.5 | 30 days supply | Qty: 30 | Fill #2

## 2015-07-13 ENCOUNTER — Encounter: Payer: Self-pay | Admitting: *Deleted

## 2015-07-17 ENCOUNTER — Telehealth: Payer: Self-pay | Admitting: Hematology and Oncology

## 2015-07-17 NOTE — Telephone Encounter (Signed)
Left message for patient re appointment for 3/3. Scheduled mailed.

## 2015-07-25 ENCOUNTER — Encounter (HOSPITAL_BASED_OUTPATIENT_CLINIC_OR_DEPARTMENT_OTHER): Payer: Self-pay | Admitting: *Deleted

## 2015-08-01 ENCOUNTER — Other Ambulatory Visit: Payer: Self-pay | Admitting: General Surgery

## 2015-08-01 ENCOUNTER — Ambulatory Visit
Admission: RE | Admit: 2015-08-01 | Discharge: 2015-08-01 | Disposition: A | Discharge status: Home / Self Care | Payer: 59 | Source: Ambulatory Visit | Attending: General Surgery | Admitting: General Surgery

## 2015-08-01 DIAGNOSIS — D0512 Intraductal carcinoma in situ of left breast: Secondary | ICD-10-CM

## 2015-08-02 ENCOUNTER — Telehealth: Payer: Self-pay | Admitting: Hematology and Oncology

## 2015-08-02 NOTE — Telephone Encounter (Signed)
Returned patient call re reschduling 3/3 f/u. Per patient needs late PM after 3 pm. Gave patient new appointment for 3/16 @ 3:15 pm.

## 2015-08-03 ENCOUNTER — Ambulatory Visit (HOSPITAL_BASED_OUTPATIENT_CLINIC_OR_DEPARTMENT_OTHER): Payer: 59 | Admitting: Certified Registered"

## 2015-08-03 ENCOUNTER — Ambulatory Visit
Admission: RE | Admit: 2015-08-03 | Discharge: 2015-08-03 | Disposition: A | Discharge status: Home / Self Care | Payer: 59 | Source: Ambulatory Visit | Attending: General Surgery | Admitting: General Surgery

## 2015-08-03 ENCOUNTER — Encounter (HOSPITAL_BASED_OUTPATIENT_CLINIC_OR_DEPARTMENT_OTHER): Payer: Self-pay | Admitting: Certified Registered"

## 2015-08-03 ENCOUNTER — Other Ambulatory Visit: Payer: Self-pay | Admitting: General Surgery

## 2015-08-03 ENCOUNTER — Encounter (HOSPITAL_BASED_OUTPATIENT_CLINIC_OR_DEPARTMENT_OTHER)
Admission: RE | Disposition: A | Discharge status: Home / Self Care | Payer: Self-pay | Source: Ambulatory Visit | Attending: General Surgery

## 2015-08-03 ENCOUNTER — Ambulatory Visit (HOSPITAL_BASED_OUTPATIENT_CLINIC_OR_DEPARTMENT_OTHER)
Admission: RE | Admit: 2015-08-03 | Discharge: 2015-08-03 | Disposition: A | Discharge status: Home / Self Care | Payer: 59 | Source: Ambulatory Visit | Attending: General Surgery | Admitting: General Surgery

## 2015-08-03 DIAGNOSIS — D0512 Intraductal carcinoma in situ of left breast: Secondary | ICD-10-CM | POA: Diagnosis not present

## 2015-08-03 DIAGNOSIS — R921 Mammographic calcification found on diagnostic imaging of breast: Secondary | ICD-10-CM | POA: Diagnosis not present

## 2015-08-03 DIAGNOSIS — F419 Anxiety disorder, unspecified: Secondary | ICD-10-CM | POA: Diagnosis not present

## 2015-08-03 DIAGNOSIS — C50912 Malignant neoplasm of unspecified site of left female breast: Secondary | ICD-10-CM | POA: Diagnosis not present

## 2015-08-03 DIAGNOSIS — I10 Essential (primary) hypertension: Secondary | ICD-10-CM | POA: Insufficient documentation

## 2015-08-03 HISTORY — PX: BREAST LUMPECTOMY WITH NEEDLE LOCALIZATION: SHX5759

## 2015-08-03 SURGERY — BREAST LUMPECTOMY WITH NEEDLE LOCALIZATION
Anesthesia: General | Site: Breast | Laterality: Left

## 2015-08-03 MED ORDER — FENTANYL CITRATE (PF) 100 MCG/2ML IJ SOLN
50.0000 ug | INTRAMUSCULAR | Status: AC | PRN
  Administered 2015-08-03: 100 ug via INTRAVENOUS
  Administered 2015-08-03 (×2): 25 ug via INTRAVENOUS
  Administered 2015-08-03: 50 ug via INTRAVENOUS

## 2015-08-03 MED ORDER — MIDAZOLAM HCL 2 MG/2ML IJ SOLN
1.0000 mg | INTRAMUSCULAR | Status: DC | PRN
  Administered 2015-08-03: 2 mg via INTRAVENOUS

## 2015-08-03 MED ORDER — LIDOCAINE HCL (CARDIAC) 20 MG/ML IV SOLN
INTRAVENOUS | Status: DC | PRN
  Administered 2015-08-03: 60 mg via INTRAVENOUS

## 2015-08-03 MED ORDER — DEXAMETHASONE SODIUM PHOSPHATE 4 MG/ML IJ SOLN
INTRAMUSCULAR | Status: DC | PRN
  Administered 2015-08-03: 10 mg via INTRAVENOUS

## 2015-08-03 MED ORDER — CEFAZOLIN SODIUM-DEXTROSE 2-3 GM-% IV SOLR
2.0000 g | INTRAVENOUS | Status: AC
  Administered 2015-08-03: 2 g via INTRAVENOUS

## 2015-08-03 MED ORDER — HYDROMORPHONE HCL 1 MG/ML IJ SOLN
INTRAMUSCULAR | Status: AC
  Filled 2015-08-03: qty 1

## 2015-08-03 MED ORDER — SCOPOLAMINE 1 MG/3DAYS TD PT72: 1.0000 | MEDICATED_PATCH | Freq: Once | TRANSDERMAL | Status: DC | PRN

## 2015-08-03 MED ORDER — PHENYLEPHRINE HCL 10 MG/ML IJ SOLN
INTRAMUSCULAR | Status: DC | PRN
  Administered 2015-08-03 (×2): 40 ug via INTRAVENOUS

## 2015-08-03 MED ORDER — ONDANSETRON HCL 4 MG/2ML IJ SOLN: 4.0000 mg | Freq: Once | INTRAMUSCULAR | Status: DC | PRN

## 2015-08-03 MED ORDER — CHLORHEXIDINE GLUCONATE 4 % EX LIQD: 1.0000 "application " | Freq: Once | CUTANEOUS | Status: DC

## 2015-08-03 MED ORDER — FENTANYL CITRATE (PF) 100 MCG/2ML IJ SOLN
INTRAMUSCULAR | Status: AC
  Filled 2015-08-03: qty 2

## 2015-08-03 MED ORDER — ONDANSETRON HCL 4 MG/2ML IJ SOLN
INTRAMUSCULAR | Status: AC
  Filled 2015-08-03: qty 2

## 2015-08-03 MED ORDER — OXYCODONE-ACETAMINOPHEN 5-325 MG PO TABS: 1.0000 | ORAL_TABLET | ORAL | Status: DC | PRN

## 2015-08-03 MED ORDER — LACTATED RINGERS IV SOLN
INTRAVENOUS | Status: DC
  Administered 2015-08-03 (×2): via INTRAVENOUS

## 2015-08-03 MED ORDER — MEPERIDINE HCL 25 MG/ML IJ SOLN: 6.2500 mg | INTRAMUSCULAR | Status: DC | PRN

## 2015-08-03 MED ORDER — ONDANSETRON HCL 4 MG/2ML IJ SOLN
INTRAMUSCULAR | Status: DC | PRN
  Administered 2015-08-03: 4 mg via INTRAVENOUS

## 2015-08-03 MED ORDER — MIDAZOLAM HCL 2 MG/2ML IJ SOLN
INTRAMUSCULAR | Status: AC
  Filled 2015-08-03: qty 2

## 2015-08-03 MED ORDER — DEXAMETHASONE SODIUM PHOSPHATE 10 MG/ML IJ SOLN
INTRAMUSCULAR | Status: AC
  Filled 2015-08-03: qty 1

## 2015-08-03 MED ORDER — BUPIVACAINE-EPINEPHRINE 0.25% -1:200000 IJ SOLN
INTRAMUSCULAR | Status: DC | PRN
  Administered 2015-08-03: 20 mL

## 2015-08-03 MED ORDER — PROPOFOL 10 MG/ML IV BOLUS
INTRAVENOUS | Status: DC | PRN
  Administered 2015-08-03: 150 mg via INTRAVENOUS

## 2015-08-03 MED ORDER — CEFAZOLIN SODIUM-DEXTROSE 2-3 GM-% IV SOLR
INTRAVENOUS | Status: AC
  Filled 2015-08-03: qty 50

## 2015-08-03 MED ORDER — HYDROMORPHONE HCL 1 MG/ML IJ SOLN
0.2500 mg | INTRAMUSCULAR | Status: DC | PRN
  Administered 2015-08-03: 0.25 mg via INTRAVENOUS
  Administered 2015-08-03: 0.5 mg via INTRAVENOUS

## 2015-08-03 MED ORDER — GLYCOPYRROLATE 0.2 MG/ML IJ SOLN: 0.2000 mg | Freq: Once | INTRAMUSCULAR | Status: DC | PRN

## 2015-08-03 MED ORDER — LIDOCAINE HCL (CARDIAC) 20 MG/ML IV SOLN
INTRAVENOUS | Status: AC
  Filled 2015-08-03: qty 5

## 2015-08-03 MED FILL — OXYCODONE/APAP 5-325: 5-325 | 5 days supply | Qty: 50 | Fill #0

## 2015-08-03 SURGICAL SUPPLY — 40 items
BLADE SURG 15 STRL LF DISP TIS (BLADE) ×1 IMPLANT
BLADE SURG 15 STRL SS (BLADE) ×2
CANISTER SUCT 1200ML W/VALVE (MISCELLANEOUS) ×2 IMPLANT
CHLORAPREP W/TINT 26ML (MISCELLANEOUS) ×2 IMPLANT
CLIP TI WIDE RED SMALL 6 (CLIP) ×2 IMPLANT
COVER BACK TABLE 60X90IN (DRAPES) ×2 IMPLANT
COVER MAYO STAND STRL (DRAPES) ×2 IMPLANT
DECANTER SPIKE VIAL GLASS SM (MISCELLANEOUS) ×2 IMPLANT
DEVICE DUBIN W/COMP PLATE 8390 (MISCELLANEOUS) ×2 IMPLANT
DRAPE LAPAROSCOPIC ABDOMINAL (DRAPES) ×2 IMPLANT
DRAPE UTILITY XL STRL (DRAPES) ×2 IMPLANT
ELECT COATED BLADE 2.86 ST (ELECTRODE) ×2 IMPLANT
ELECT REM PT RETURN 9FT ADLT (ELECTROSURGICAL) ×2
ELECTRODE REM PT RTRN 9FT ADLT (ELECTROSURGICAL) ×1 IMPLANT
GLOVE BIO SURGEON STRL SZ7.5 (GLOVE) ×2 IMPLANT
GLOVE BIOGEL PI IND STRL 7.0 (GLOVE) ×2 IMPLANT
GLOVE BIOGEL PI INDICATOR 7.0 (GLOVE) ×2
GLOVE ECLIPSE 6.5 STRL STRAW (GLOVE) ×2 IMPLANT
GLOVE EXAM NITRILE EXT CUFF MD (GLOVE) ×2 IMPLANT
GLOVE SURG SS PI 7.0 STRL IVOR (GLOVE) ×2 IMPLANT
GOWN STRL REUS W/ TWL LRG LVL3 (GOWN DISPOSABLE) ×3 IMPLANT
GOWN STRL REUS W/TWL LRG LVL3 (GOWN DISPOSABLE) ×6
ILLUMINATOR WAVEGUIDE N/F (MISCELLANEOUS) IMPLANT
KIT MARKER MARGIN INK (KITS) ×2 IMPLANT
LIGHT WAVEGUIDE WIDE FLAT (MISCELLANEOUS) IMPLANT
LIQUID BAND (GAUZE/BANDAGES/DRESSINGS) ×2 IMPLANT
NEEDLE HYPO 25X1 1.5 SAFETY (NEEDLE) ×2 IMPLANT
NS IRRIG 1000ML POUR BTL (IV SOLUTION) ×2 IMPLANT
PACK BASIN DAY SURGERY FS (CUSTOM PROCEDURE TRAY) ×2 IMPLANT
PENCIL BUTTON HOLSTER BLD 10FT (ELECTRODE) ×2 IMPLANT
SLEEVE SCD COMPRESS KNEE MED (MISCELLANEOUS) ×2 IMPLANT
SPONGE LAP 18X18 X RAY DECT (DISPOSABLE) ×2 IMPLANT
SUT MON AB 4-0 PC3 18 (SUTURE) ×2 IMPLANT
SUT SILK 2 0 SH (SUTURE) IMPLANT
SUT VICRYL 3-0 CR8 SH (SUTURE) ×2 IMPLANT
SYR CONTROL 10ML LL (SYRINGE) ×2 IMPLANT
TOWEL OR 17X24 6PK STRL BLUE (TOWEL DISPOSABLE) ×2 IMPLANT
TOWEL OR NON WOVEN STRL DISP B (DISPOSABLE) IMPLANT
TUBE CONNECTING 20X1/4 (TUBING) ×2 IMPLANT
YANKAUER SUCT BULB TIP NO VENT (SUCTIONS) ×2 IMPLANT

## 2015-08-03 NOTE — H&P (Signed)
Erin Bowers  Location: [facility] Patient #: [id number] DOB: 12-17-1965 Undefined / Language: Erin Bowers / Race: Black or African American Female   History of Present Illness The patient is a 50 year old female who presents with breast cancer. We are asked to see the patient in consultation by Dr. [name] to evaluate her for left breast dcis. The patient is a 50 yo bf who presents with calcs in the inner left breast measuring about 4cm. These were biopsied and came back as dcis. She was er and pr +. She does not take any female hormones. She denies any breast pain or discharge from the nipple. She has no personal or family history of breast cancer.   Other Problems  High blood pressure Lump In Breast  Past Surgical History Oral Surgery  Diagnostic Studies History Colonoscopy never Mammogram within last year Pap Smear 1-5 years ago  Medication History  No Current Medications Medications Reconciled  Social History  Alcohol use Remotely quit alcohol use. Caffeine use Carbonated beverages. No drug use Tobacco use Never smoker.  Family History  Cerebrovascular Accident Mother. Diabetes Mellitus Mother. Hypertension Mother.  Pregnancy / Birth History  Age at menarche 64 years. Age of menopause <47 Gravida 0 Irregular periods Para 0    Review of Systems General Not Present- Appetite Loss, Chills, Fatigue, Fever, Night Sweats, Weight Gain and Weight Loss. Skin Not Present- Change in Wart/Mole, Dryness, Hives, Jaundice, New Lesions, Non-Healing Wounds, Rash and Ulcer. HEENT Present- Wears glasses/contact lenses. Not Present- Earache, Hearing Loss, Hoarseness, Nose Bleed, Oral Ulcers, Ringing in the Ears, Seasonal Allergies, Sinus Pain, Sore Throat, Visual Disturbances and Yellow Eyes. Respiratory Not Present- Bloody sputum, Chronic Cough, Difficulty Breathing, Snoring and Wheezing. Breast Present- Breast Mass. Not Present- Breast Pain, Nipple  Discharge and Skin Changes. Cardiovascular Not Present- Chest Pain, Difficulty Breathing Lying Down, Leg Cramps, Palpitations, Rapid Heart Rate, Shortness of Breath and Swelling of Extremities. Gastrointestinal Not Present- Abdominal Pain, Bloating, Bloody Stool, Change in Bowel Habits, Chronic diarrhea, Constipation, Difficulty Swallowing, Excessive gas, Gets full quickly at meals, Hemorrhoids, Indigestion, Nausea, Rectal Pain and Vomiting. Female Genitourinary Not Present- Frequency, Nocturia, Painful Urination, Pelvic Pain and Urgency. Musculoskeletal Not Present- Back Pain, Joint Pain, Joint Stiffness, Muscle Pain, Muscle Weakness and Swelling of Extremities. Neurological Not Present- Decreased Memory, Fainting, Headaches, Numbness, Seizures, Tingling, Tremor, Trouble walking and Weakness. Psychiatric Not Present- Anxiety, Bipolar, Change in Sleep Pattern, Depression, Fearful and Frequent crying. Endocrine Present- Hot flashes. Not Present- Cold Intolerance, Excessive Hunger, Hair Changes, Heat Intolerance and New Diabetes. Hematology Not Present- Easy Bruising, Excessive bleeding, Gland problems, HIV and Persistent Infections.   Physical Exam  General Mental Status-Alert. General Appearance-Consistent with stated age. Hydration-Well hydrated. Voice-Normal.  Head and Neck Head-normocephalic, atraumatic with no lesions or palpable masses. Trachea-midline. Thyroid Gland Characteristics - normal size and consistency.  Eye Eyeball - Bilateral-Extraocular movements intact. Sclera/Conjunctiva - Bilateral-No scleral icterus.  Chest and Lung Exam Chest and lung exam reveals -quiet, even and easy respiratory effort with no use of accessory muscles and on auscultation, normal breath sounds, no adventitious sounds and normal vocal resonance. Inspection Chest Wall - Normal. Back - normal.  Breast Note: There is no palpable mass in either breast. There is no palpable  axillary, supraclavicular, or cervical lymphadenopathy.   Cardiovascular Cardiovascular examination reveals -normal heart sounds, regular rate and rhythm with no murmurs and normal pedal pulses bilaterally.  Abdomen Inspection Inspection of the abdomen reveals - No Hernias. Skin - Scar - no  surgical scars. Palpation/Percussion Palpation and Percussion of the abdomen reveal - Soft, Non Tender, No Rebound tenderness, No Rigidity (guarding) and No hepatosplenomegaly. Auscultation Auscultation of the abdomen reveals - Bowel sounds normal.  Neurologic Neurologic evaluation reveals -alert and oriented x 3 with no impairment of recent or remote memory. Mental Status-Normal.  Musculoskeletal Normal Exam - Left-Upper Extremity Strength Normal and Lower Extremity Strength Normal. Normal Exam - Right-Upper Extremity Strength Normal and Lower Extremity Strength Normal.  Lymphatic Head & Neck  General Head & Neck Lymphatics: Bilateral - Description - Normal. Axillary  General Axillary Region: Bilateral - Description - Normal. Tenderness - Non Tender. Femoral & Inguinal  Generalized Femoral & Inguinal Lymphatics: Bilateral - Description - Normal. Tenderness - Non Tender.    Assessment & Plan DCIS (DUCTAL CARCINOMA IN SITU), LEFT (D05.12) Impression: The patient has a 4cm area of dcis with calcs in the inner left breast. I have talked to her in detail about the different options for treatment and at this point she favors breast conservation. Since she has such a large breast I think this would be possible and still give her a good cosmetic result. I have discussed with her in detail with risks and benefits of the surgery as well as some of the technical aspects and she understands and wishes to proceed. I will plan for a left breast bracketed wire localized lumpectomy    Signed by Luella Cook, MD

## 2015-08-03 NOTE — Op Note (Signed)
08/03/2015  12:08 PM  PATIENT:  Erin Bowers  50 y.o. female  PRE-OPERATIVE DIAGNOSIS:  LEFT BREAST DCIS  POST-OPERATIVE DIAGNOSIS:  LEFT BREAST DCIS  PROCEDURE:  Procedure(s): BREAST LUMPECTOMY WITH BRACKETED NEEDLE LOCALIZATION (Left)  SURGEON:  Surgeon(s) and Role:    * Jovita Kussmaul, MD - Primary  PHYSICIAN ASSISTANT:   ASSISTANTS: none   ANESTHESIA:   general  EBL:  Total I/O In: 1000 [I.V.:1000] Out: -   BLOOD ADMINISTERED:none  DRAINS: none   LOCAL MEDICATIONS USED:  MARCAINE     SPECIMEN:  Source of Specimen:  left breast tissue with additional superior margins  DISPOSITION OF SPECIMEN:  PATHOLOGY  COUNTS:  YES  TOURNIQUET:  * No tourniquets in log *  DICTATION: .Dragon Dictation   After informed consent was obtained the patient was brought to the operating room and placed in the supine position on the operating table. After adequate induction of general anesthesia the patient's left breast area was prepped with ChloraPrep, allowed to dry, and draped in usual sterile manner. An appropriate timeout was performed. Previously 2 wires were placed in the upper inner aspect of the left breast bracket an area of ductal carcinoma in situ. An elliptical incision was made in the skin just below the entry site of both wires. The incision was carried through the skin and subcutaneous tissue sharply with electrocautery. The path of the wires could be palpated. The dissection was carried down past the path of the wires all the way to the chest wall. Once the specimen was removed it was oriented with the appropriate paint colors. A specimen radiograph showed the wires and calcifications to be within the specimen. The calcifications appeared to come close to the superior margin. Additional superior and medial and superior and lateral margins were taken sharply with the electrocautery and were marked appropriately. Next the wound was irrigated with copious amounts of saline and  infiltrated with quarter percent Marcaine. Hemostasis was achieved using the Bovie electrocautery. The deep layer of the wound was closed in layers of interrupted 3-0 Vicryl stitches. The skin was then closed with interrupted 4-0 Monocryl subcuticular stitches. Prior to closing the cavity was marked with clips. Dermabond dressings were then applied. The patient tolerated the procedure well. At the end of the case all needle sponge counts were correct. The patient was then awakened and taken to recovery in stable condition.  PLAN OF CARE: Discharge to home after PACU  PATIENT DISPOSITION:  PACU - hemodynamically stable.   Delay start of Pharmacological VTE agent (>24hrs) due to surgical blood loss or risk of bleeding: not applicable

## 2015-08-03 NOTE — Interval H&P Note (Signed)
History and Physical Interval Note:  08/03/2015 10:42 AM  Erin Bowers  has presented today for surgery, with the diagnosis of LEFT BREAST DCIS  The various methods of treatment have been discussed with the patient and family. After consideration of risks, benefits and other options for treatment, the patient has consented to  Procedure(s): BREAST LUMPECTOMY WITH BRACKETED NEEDLE LOCALIZATION (Left) as a surgical intervention .  The patient's history has been reviewed, patient examined, no change in status, stable for surgery.  I have reviewed the patient's chart and labs.  Questions were answered to the patient's satisfaction.     TOTH III,Nicko Daher S

## 2015-08-03 NOTE — Anesthesia Postprocedure Evaluation (Signed)
Anesthesia Post Note  Patient: Erin Bowers  Procedure(s) Performed: Procedure(s) (LRB): BREAST LUMPECTOMY WITH BRACKETED NEEDLE LOCALIZATION (Left)  Patient location during evaluation: PACU Anesthesia Type: General Level of consciousness: awake and alert Pain management: pain level controlled Vital Signs Assessment: post-procedure vital signs reviewed and stable Respiratory status: spontaneous breathing, nonlabored ventilation, respiratory function stable and patient connected to nasal cannula oxygen Cardiovascular status: blood pressure returned to baseline and stable Postop Assessment: no signs of nausea or vomiting Anesthetic complications: no    Last Vitals:  Filed Vitals:   08/03/15 1313 08/03/15 1328  BP:  138/83  Pulse: 78 72  Temp:  36.9 C  Resp: 12 16    Last Pain:  Filed Vitals:   08/03/15 1330  PainSc: 0-No pain                 Zolton Dowson DAVID

## 2015-08-03 NOTE — Interval H&P Note (Signed)
History and Physical Interval Note:  08/03/2015 10:43 AM  Erin Bowers  has presented today for surgery, with the diagnosis of LEFT BREAST DCIS  The various methods of treatment have been discussed with the patient and family. After consideration of risks, benefits and other options for treatment, the patient has consented to  Procedure(s): BREAST LUMPECTOMY WITH BRACKETED NEEDLE LOCALIZATION (Left) as a surgical intervention .  The patient's history has been reviewed, patient examined, no change in status, stable for surgery.  I have reviewed the patient's chart and labs.  Questions were answered to the patient's satisfaction.     TOTH III,Agamjot Kilgallon S

## 2015-08-03 NOTE — Anesthesia Preprocedure Evaluation (Addendum)
Anesthesia Evaluation  Patient identified by MRN, date of birth, ID band Patient awake    Reviewed: Allergy & Precautions, NPO status , Patient's Chart, lab work & pertinent test results  Airway Mallampati: I  TM Distance: >3 FB Neck ROM: Full    Dental   Pulmonary    Pulmonary exam normal        Cardiovascular hypertension, Pt. on medications Normal cardiovascular exam     Neuro/Psych PSYCHIATRIC DISORDERS Anxiety    GI/Hepatic   Endo/Other    Renal/GU      Musculoskeletal   Abdominal   Peds  Hematology   Anesthesia Other Findings   Reproductive/Obstetrics                            Anesthesia Physical Anesthesia Plan  ASA: II  Anesthesia Plan: General   Post-op Pain Management:    Induction: Intravenous  Airway Management Planned: LMA  Additional Equipment:   Intra-op Plan:   Post-operative Plan: Extubation in OR  Informed Consent: I have reviewed the patients History and Physical, chart, labs and discussed the procedure including the risks, benefits and alternatives for the proposed anesthesia with the patient or authorized representative who has indicated his/her understanding and acceptance.     Plan Discussed with: CRNA and Surgeon  Anesthesia Plan Comments:         Anesthesia Quick Evaluation

## 2015-08-03 NOTE — Transfer of Care (Signed)
Immediate Anesthesia Transfer of Care Note  Patient: Erin Bowers  Procedure(s) Performed: Procedure(s): BREAST LUMPECTOMY WITH BRACKETED NEEDLE LOCALIZATION (Left)  Patient Location: PACU  Anesthesia Type:General  Level of Consciousness: awake, alert , oriented and patient cooperative  Airway & Oxygen Therapy: Patient Spontanous Breathing and Patient connected to face mask oxygen  Post-op Assessment: Report given to RN and Post -op Vital signs reviewed and stable  Post vital signs: Reviewed and stable  Last Vitals:  Filed Vitals:   08/03/15 0958  BP: 154/87  Pulse: 60  Temp: 36.8 C  Resp: 18    Complications: No apparent anesthesia complications

## 2015-08-03 NOTE — Discharge Instructions (Signed)

## 2015-08-03 NOTE — Anesthesia Procedure Notes (Signed)
Procedure Name: LMA Insertion Date/Time: 08/03/2015 11:01 AM Performed by: Kamareon Sciandra D Pre-anesthesia Checklist: Patient identified, Emergency Drugs available, Suction available and Patient being monitored Patient Re-evaluated:Patient Re-evaluated prior to inductionOxygen Delivery Method: Circle System Utilized Preoxygenation: Pre-oxygenation with 100% oxygen Intubation Type: IV induction Ventilation: Mask ventilation without difficulty LMA: LMA inserted LMA Size: 4.0 Number of attempts: 1 Airway Equipment and Method: Bite block Placement Confirmation: positive ETCO2 Tube secured with: Tape Dental Injury: Teeth and Oropharynx as per pre-operative assessment

## 2015-08-06 ENCOUNTER — Encounter (HOSPITAL_BASED_OUTPATIENT_CLINIC_OR_DEPARTMENT_OTHER): Payer: Self-pay | Admitting: General Surgery

## 2015-08-08 ENCOUNTER — Other Ambulatory Visit: Payer: Self-pay | Admitting: General Surgery

## 2015-08-10 ENCOUNTER — Ambulatory Visit: Payer: 59 | Admitting: Hematology and Oncology

## 2015-08-10 ENCOUNTER — Other Ambulatory Visit: Payer: Self-pay | Admitting: General Surgery

## 2015-08-10 MED FILL — OLMESARTAN-HCTZ 20-12.5 MG: 20-12.5 | 30 days supply | Qty: 30 | Fill #3

## 2015-08-14 ENCOUNTER — Encounter (HOSPITAL_BASED_OUTPATIENT_CLINIC_OR_DEPARTMENT_OTHER): Payer: Self-pay | Admitting: *Deleted

## 2015-08-17 ENCOUNTER — Ambulatory Visit (HOSPITAL_BASED_OUTPATIENT_CLINIC_OR_DEPARTMENT_OTHER): Payer: 59 | Admitting: Anesthesiology

## 2015-08-17 ENCOUNTER — Encounter (HOSPITAL_BASED_OUTPATIENT_CLINIC_OR_DEPARTMENT_OTHER)
Admission: RE | Disposition: A | Discharge status: Home / Self Care | Payer: Self-pay | Source: Ambulatory Visit | Attending: General Surgery

## 2015-08-17 ENCOUNTER — Ambulatory Visit (HOSPITAL_BASED_OUTPATIENT_CLINIC_OR_DEPARTMENT_OTHER)
Admission: RE | Admit: 2015-08-17 | Discharge: 2015-08-17 | Disposition: A | Discharge status: Home / Self Care | Payer: 59 | Source: Ambulatory Visit | Attending: General Surgery | Admitting: General Surgery

## 2015-08-17 ENCOUNTER — Encounter (HOSPITAL_BASED_OUTPATIENT_CLINIC_OR_DEPARTMENT_OTHER): Payer: Self-pay | Admitting: Anesthesiology

## 2015-08-17 DIAGNOSIS — D0512 Intraductal carcinoma in situ of left breast: Secondary | ICD-10-CM | POA: Diagnosis not present

## 2015-08-17 DIAGNOSIS — I10 Essential (primary) hypertension: Secondary | ICD-10-CM | POA: Diagnosis not present

## 2015-08-17 DIAGNOSIS — Z79899 Other long term (current) drug therapy: Secondary | ICD-10-CM | POA: Insufficient documentation

## 2015-08-17 DIAGNOSIS — Z170 Estrogen receptor positive status [ER+]: Secondary | ICD-10-CM | POA: Diagnosis not present

## 2015-08-17 DIAGNOSIS — N641 Fat necrosis of breast: Secondary | ICD-10-CM | POA: Diagnosis not present

## 2015-08-17 HISTORY — PX: RE-EXCISION OF BREAST CANCER,SUPERIOR MARGINS: SHX6047

## 2015-08-17 SURGERY — RE-EXCISION OF BREAST CANCER,SUPERIOR MARGINS
Anesthesia: General | Site: Breast | Laterality: Left

## 2015-08-17 MED ORDER — ONDANSETRON HCL 4 MG/2ML IJ SOLN
INTRAMUSCULAR | Status: DC | PRN
  Administered 2015-08-17: 4 mg via INTRAVENOUS

## 2015-08-17 MED ORDER — OXYCODONE-ACETAMINOPHEN 5-325 MG PO TABS: 1.0000 | ORAL_TABLET | ORAL | Status: DC | PRN

## 2015-08-17 MED ORDER — DEXAMETHASONE SODIUM PHOSPHATE 4 MG/ML IJ SOLN
INTRAMUSCULAR | Status: DC | PRN
  Administered 2015-08-17: 10 mg via INTRAVENOUS

## 2015-08-17 MED ORDER — MIDAZOLAM HCL 2 MG/2ML IJ SOLN
INTRAMUSCULAR | Status: AC
  Filled 2015-08-17: qty 2

## 2015-08-17 MED ORDER — PROPOFOL 500 MG/50ML IV EMUL
INTRAVENOUS | Status: AC
  Filled 2015-08-17: qty 50

## 2015-08-17 MED ORDER — CHLORHEXIDINE GLUCONATE 4 % EX LIQD: 1.0000 "application " | Freq: Once | CUTANEOUS | Status: DC

## 2015-08-17 MED ORDER — MIDAZOLAM HCL 2 MG/2ML IJ SOLN
1.0000 mg | INTRAMUSCULAR | Status: DC | PRN
  Administered 2015-08-17: 2 mg via INTRAVENOUS

## 2015-08-17 MED ORDER — CEFAZOLIN SODIUM-DEXTROSE 2-3 GM-% IV SOLR: 2.0000 g | INTRAVENOUS | Status: DC

## 2015-08-17 MED ORDER — BUPIVACAINE HCL (PF) 0.25 % IJ SOLN
INTRAMUSCULAR | Status: DC | PRN
  Administered 2015-08-17: 20 mL

## 2015-08-17 MED ORDER — HYDROMORPHONE HCL 1 MG/ML IJ SOLN
INTRAMUSCULAR | Status: AC
  Filled 2015-08-17: qty 1

## 2015-08-17 MED ORDER — ONDANSETRON HCL 4 MG/2ML IJ SOLN
INTRAMUSCULAR | Status: AC
  Filled 2015-08-17: qty 2

## 2015-08-17 MED ORDER — MEPERIDINE HCL 25 MG/ML IJ SOLN: 6.2500 mg | INTRAMUSCULAR | Status: DC | PRN

## 2015-08-17 MED ORDER — DEXAMETHASONE SODIUM PHOSPHATE 10 MG/ML IJ SOLN
INTRAMUSCULAR | Status: AC
  Filled 2015-08-17: qty 1

## 2015-08-17 MED ORDER — PHENYLEPHRINE HCL 10 MG/ML IJ SOLN
INTRAMUSCULAR | Status: DC | PRN
  Administered 2015-08-17 (×2): 80 ug via INTRAVENOUS

## 2015-08-17 MED ORDER — CEFAZOLIN SODIUM-DEXTROSE 2-3 GM-% IV SOLR
INTRAVENOUS | Status: AC
  Filled 2015-08-17: qty 50

## 2015-08-17 MED ORDER — LACTATED RINGERS IV SOLN
INTRAVENOUS | Status: DC
  Administered 2015-08-17 (×2): via INTRAVENOUS

## 2015-08-17 MED ORDER — METHYLENE BLUE 0.5 % INJ SOLN
INTRAVENOUS | Status: AC
  Filled 2015-08-17: qty 10

## 2015-08-17 MED ORDER — HYDROMORPHONE HCL 1 MG/ML IJ SOLN
0.2500 mg | INTRAMUSCULAR | Status: DC | PRN
  Administered 2015-08-17: 0.5 mg via INTRAVENOUS

## 2015-08-17 MED ORDER — OXYCODONE HCL 5 MG PO TABS: 5.0000 mg | ORAL_TABLET | Freq: Once | ORAL | Status: DC | PRN

## 2015-08-17 MED ORDER — BUPIVACAINE HCL (PF) 0.25 % IJ SOLN
INTRAMUSCULAR | Status: AC
  Filled 2015-08-17: qty 30

## 2015-08-17 MED ORDER — CEFAZOLIN SODIUM-DEXTROSE 2-3 GM-% IV SOLR
2.0000 g | INTRAVENOUS | Status: AC
  Administered 2015-08-17: 2 g via INTRAVENOUS

## 2015-08-17 MED ORDER — EPHEDRINE SULFATE 50 MG/ML IJ SOLN
INTRAMUSCULAR | Status: DC | PRN
  Administered 2015-08-17: 10 mg via INTRAVENOUS

## 2015-08-17 MED ORDER — BUPIVACAINE-EPINEPHRINE (PF) 0.5% -1:200000 IJ SOLN
INTRAMUSCULAR | Status: AC
  Filled 2015-08-17: qty 30

## 2015-08-17 MED ORDER — FENTANYL CITRATE (PF) 100 MCG/2ML IJ SOLN
50.0000 ug | INTRAMUSCULAR | Status: AC | PRN
  Administered 2015-08-17: 50 ug via INTRAVENOUS
  Administered 2015-08-17 (×2): 25 ug via INTRAVENOUS
  Administered 2015-08-17: 100 ug via INTRAVENOUS

## 2015-08-17 MED ORDER — LIDOCAINE HCL (CARDIAC) 20 MG/ML IV SOLN
INTRAVENOUS | Status: AC
  Filled 2015-08-17: qty 5

## 2015-08-17 MED ORDER — FENTANYL CITRATE (PF) 100 MCG/2ML IJ SOLN
INTRAMUSCULAR | Status: AC
  Filled 2015-08-17: qty 2

## 2015-08-17 MED ORDER — GLYCOPYRROLATE 0.2 MG/ML IJ SOLN: 0.2000 mg | Freq: Once | INTRAMUSCULAR | Status: DC | PRN

## 2015-08-17 MED ORDER — SCOPOLAMINE 1 MG/3DAYS TD PT72: 1.0000 | MEDICATED_PATCH | Freq: Once | TRANSDERMAL | Status: DC | PRN

## 2015-08-17 MED ORDER — LIDOCAINE HCL (CARDIAC) 20 MG/ML IV SOLN
INTRAVENOUS | Status: DC | PRN
  Administered 2015-08-17: 80 mg via INTRAVENOUS

## 2015-08-17 MED ORDER — PROPOFOL 10 MG/ML IV BOLUS
INTRAVENOUS | Status: DC | PRN
  Administered 2015-08-17: 200 mg via INTRAVENOUS

## 2015-08-17 MED ORDER — OXYCODONE HCL 5 MG/5ML PO SOLN: 5.0000 mg | Freq: Once | ORAL | Status: DC | PRN

## 2015-08-17 MED ORDER — SODIUM CHLORIDE 0.9 % IJ SOLN
INTRAMUSCULAR | Status: AC
  Filled 2015-08-17: qty 10

## 2015-08-17 MED FILL — OXYCODONE/APAP 5-325: 5-325 | 4 days supply | Qty: 50 | Fill #0

## 2015-08-17 SURGICAL SUPPLY — 40 items
APPLIER CLIP 9.375 MED OPEN (MISCELLANEOUS)
APR CLP MED 9.3 20 MLT OPN (MISCELLANEOUS)
BLADE SURG 15 STRL LF DISP TIS (BLADE) ×1 IMPLANT
BLADE SURG 15 STRL SS (BLADE) ×2
CANISTER SUCT 1200ML W/VALVE (MISCELLANEOUS) ×2 IMPLANT
CHLORAPREP W/TINT 26ML (MISCELLANEOUS) ×2 IMPLANT
CLIP APPLIE 9.375 MED OPEN (MISCELLANEOUS) IMPLANT
COVER BACK TABLE 60X90IN (DRAPES) ×2 IMPLANT
COVER MAYO STAND STRL (DRAPES) ×2 IMPLANT
DECANTER SPIKE VIAL GLASS SM (MISCELLANEOUS) IMPLANT
DEVICE DUBIN W/COMP PLATE 8390 (MISCELLANEOUS) ×2 IMPLANT
DRAPE LAPAROSCOPIC ABDOMINAL (DRAPES) ×2 IMPLANT
DRAPE UTILITY XL STRL (DRAPES) ×2 IMPLANT
ELECT COATED BLADE 2.86 ST (ELECTRODE) ×2 IMPLANT
ELECT REM PT RETURN 9FT ADLT (ELECTROSURGICAL) ×2
ELECTRODE REM PT RTRN 9FT ADLT (ELECTROSURGICAL) ×1 IMPLANT
GLOVE BIO SURGEON STRL SZ7.5 (GLOVE) ×2 IMPLANT
GLOVE BIOGEL PI IND STRL 7.0 (GLOVE) ×1 IMPLANT
GLOVE BIOGEL PI INDICATOR 7.0 (GLOVE) ×1
GLOVE ECLIPSE 6.5 STRL STRAW (GLOVE) ×2 IMPLANT
GLOVE EXAM NITRILE EXT CUFF MD (GLOVE) ×2 IMPLANT
GOWN STRL REUS W/ TWL LRG LVL3 (GOWN DISPOSABLE) ×2 IMPLANT
GOWN STRL REUS W/TWL LRG LVL3 (GOWN DISPOSABLE) ×4
KIT MARKER MARGIN INK (KITS) ×2 IMPLANT
LIQUID BAND (GAUZE/BANDAGES/DRESSINGS) ×2 IMPLANT
NEEDLE HYPO 25X1 1.5 SAFETY (NEEDLE) ×2 IMPLANT
NS IRRIG 1000ML POUR BTL (IV SOLUTION) ×2 IMPLANT
PACK BASIN DAY SURGERY FS (CUSTOM PROCEDURE TRAY) ×2 IMPLANT
PENCIL BUTTON HOLSTER BLD 10FT (ELECTRODE) ×2 IMPLANT
SLEEVE SCD COMPRESS KNEE MED (MISCELLANEOUS) ×2 IMPLANT
SPONGE LAP 18X18 X RAY DECT (DISPOSABLE) ×2 IMPLANT
STAPLER VISISTAT 35W (STAPLE) IMPLANT
SUT MON AB 4-0 PC3 18 (SUTURE) ×2 IMPLANT
SUT SILK 2 0 SH (SUTURE) IMPLANT
SUT VICRYL 3-0 CR8 SH (SUTURE) ×2 IMPLANT
SYR CONTROL 10ML LL (SYRINGE) ×2 IMPLANT
TOWEL OR 17X24 6PK STRL BLUE (TOWEL DISPOSABLE) ×2 IMPLANT
TOWEL OR NON WOVEN STRL DISP B (DISPOSABLE) IMPLANT
TUBE CONNECTING 20X1/4 (TUBING) ×2 IMPLANT
YANKAUER SUCT BULB TIP NO VENT (SUCTIONS) ×2 IMPLANT

## 2015-08-17 NOTE — H&P (Signed)
Erin Bowers  Location: [facility] Surgery Patient #: [id number] DOB: 09/19/1965 Undefined / Language: [name] / Race: Black or African American Female   History of Present Illness The patient is a 50 year old female who presents with breast cancer. We are asked to see the patient in consultation by Dr. [name] to evaluate her for left breast dcis. The patient is a 50 yo bf who presents with calcs in the inner left breast measuring about 4cm. These were biopsied and came back as dcis. She was er and pr +. She does not take any female hormones. She denies any breast pain or discharge from the nipple. She has no personal or family history of breast cancer.   Other Problems High blood pressure Lump In Breast  Past Surgical History  Oral Surgery  Diagnostic Studies HistoryColonoscopy never Mammogram within last year Pap Smear 1-5 years ago  Medication History No Current Medications Medications Reconciled  Social History Alcohol use Remotely quit alcohol use. Caffeine use Carbonated beverages. No drug use Tobacco use Never smoker.  Family History Cerebrovascular Accident Mother. Diabetes Mellitus Mother. Hypertension Mother.  Pregnancy / Birth HistoryAge at menarche 14 years. Age of menopause <47 Gravida 0 Irregular periods Para 0    Review of Systems  General Not Present- Appetite Loss, Chills, Fatigue, Fever, Night Sweats, Weight Gain and Weight Loss. Skin Not Present- Change in Wart/Mole, Dryness, Hives, Jaundice, New Lesions, Non-Healing Wounds, Rash and Ulcer. HEENT Present- Wears glasses/contact lenses. Not Present- Earache, Hearing Loss, Hoarseness, Nose Bleed, Oral Ulcers, Ringing in the Ears, Seasonal Allergies, Sinus Pain, Sore Throat, Visual Disturbances and Yellow Eyes. Respiratory Not Present- Bloody sputum, Chronic Cough, Difficulty Breathing, Snoring and Wheezing. Breast Present- Breast Mass. Not Present- Breast Pain, Nipple  Discharge and Skin Changes. Cardiovascular Not Present- Chest Pain, Difficulty Breathing Lying Down, Leg Cramps, Palpitations, Rapid Heart Rate, Shortness of Breath and Swelling of Extremities. Gastrointestinal Not Present- Abdominal Pain, Bloating, Bloody Stool, Change in Bowel Habits, Chronic diarrhea, Constipation, Difficulty Swallowing, Excessive gas, Gets full quickly at meals, Hemorrhoids, Indigestion, Nausea, Rectal Pain and Vomiting. Female Genitourinary Not Present- Frequency, Nocturia, Painful Urination, Pelvic Pain and Urgency. Musculoskeletal Not Present- Back Pain, Joint Pain, Joint Stiffness, Muscle Pain, Muscle Weakness and Swelling of Extremities. Neurological Not Present- Decreased Memory, Fainting, Headaches, Numbness, Seizures, Tingling, Tremor, Trouble walking and Weakness. Psychiatric Not Present- Anxiety, Bipolar, Change in Sleep Pattern, Depression, Fearful and Frequent crying. Endocrine Present- Hot flashes. Not Present- Cold Intolerance, Excessive Hunger, Hair Changes, Heat Intolerance and New Diabetes. Hematology Not Present- Easy Bruising, Excessive bleeding, Gland problems, HIV and Persistent Infections.   Physical Exam  General Mental Status-Alert. General Appearance-Consistent with stated age. Hydration-Well hydrated. Voice-Normal.  Head and Neck Head-normocephalic, atraumatic with no lesions or palpable masses. Trachea-midline. Thyroid Gland Characteristics - normal size and consistency.  Eye Eyeball - Bilateral-Extraocular movements intact. Sclera/Conjunctiva - Bilateral-No scleral icterus.  Chest and Lung Exam Chest and lung exam reveals -quiet, even and easy respiratory effort with no use of accessory muscles and on auscultation, normal breath sounds, no adventitious sounds and normal vocal resonance. Inspection Chest Wall - Normal. Back - normal.  Breast Note: There is no palpable mass in either breast. There is no palpable  axillary, supraclavicular, or cervical lymphadenopathy.   Cardiovascular Cardiovascular examination reveals -normal heart sounds, regular rate and rhythm with no murmurs and normal pedal pulses bilaterally.  Abdomen Inspection Inspection of the abdomen reveals - No Hernias. Skin - Scar - no surgical scars. Palpation/Percussion Palpation and  Percussion of the abdomen reveal - Soft, Non Tender, No Rebound tenderness, No Rigidity (guarding) and No hepatosplenomegaly. Auscultation Auscultation of the abdomen reveals - Bowel sounds normal.  Neurologic Neurologic evaluation reveals -alert and oriented x 3 with no impairment of recent or remote memory. Mental Status-Normal.  Musculoskeletal Normal Exam - Left-Upper Extremity Strength Normal and Lower Extremity Strength Normal. Normal Exam - Right-Upper Extremity Strength Normal and Lower Extremity Strength Normal.  Lymphatic Head & Neck  General Head & Neck Lymphatics: Bilateral - Description - Normal. Axillary  General Axillary Region: Bilateral - Description - Normal. Tenderness - Non Tender. Femoral & Inguinal  Generalized Femoral & Inguinal Lymphatics: Bilateral - Description - Normal. Tenderness - Non Tender.    Assessment & Plan  DCIS (DUCTAL CARCINOMA IN SITU), LEFT (D05.12) Impression: The patient has a 4cm area of dcis with calcs in the inner left breast. I have talked to her in detail about the different options for treatment and at this point she favors breast conservation. Since she has such a large breast I think this would be possible and still give her a good cosmetic result. I have discussed with her in detail with risks and benefits of the surgery as well as some of the technical aspects and she understands and wishes to proceed. I will plan for a left breast radioactive seed localized lumpectomy  Positive superior margin. Plan to excise  Signed by Luella Cook, MD

## 2015-08-17 NOTE — Anesthesia Postprocedure Evaluation (Signed)
Anesthesia Post Note  Patient: Erin Bowers  Procedure(s) Performed: Procedure(s) (LRB): RE-EXCISION OF BREAST CANCER,SUPERIOR MARGINS (Left)  Patient location during evaluation: PACU Anesthesia Type: General Level of consciousness: awake and alert Pain management: pain level controlled Vital Signs Assessment: post-procedure vital signs reviewed and stable Respiratory status: spontaneous breathing, nonlabored ventilation and respiratory function stable Cardiovascular status: blood pressure returned to baseline and stable Postop Assessment: no signs of nausea or vomiting Anesthetic complications: no    Last Vitals:  Filed Vitals:   08/17/15 1011 08/17/15 1041  BP:  137/82  Pulse: 89 89  Temp:  36.5 C  Resp: 20 20    Last Pain:  Filed Vitals:   08/17/15 1042  PainSc: 2                  Meryn Sarracino A

## 2015-08-17 NOTE — Interval H&P Note (Signed)
History and Physical Interval Note:  08/17/2015 8:23 AM  Erin Bowers  has presented today for surgery, with the diagnosis of LEFT BREAST DCIS  The various methods of treatment have been discussed with the patient and family. After consideration of risks, benefits and other options for treatment, the patient has consented to  Procedure(s): RE-EXCISION OF BREAST CANCER,SUPERIOR MARGINS (Left) as a surgical intervention .  The patient's history has been reviewed, patient examined, no change in status, stable for surgery.  I have reviewed the patient's chart and labs.  Questions were answered to the patient's satisfaction.     TOTH III,Mylan Lengyel S

## 2015-08-17 NOTE — Discharge Instructions (Signed)

## 2015-08-17 NOTE — Anesthesia Preprocedure Evaluation (Signed)
Anesthesia Evaluation  Patient identified by MRN, date of birth, ID band Patient awake    Reviewed: Allergy & Precautions, NPO status , Patient's Chart, lab work & pertinent test results  Airway Mallampati: I  TM Distance: >3 FB Neck ROM: Full    Dental  (+) Teeth Intact, Dental Advisory Given   Pulmonary  breath sounds clear to auscultation        Cardiovascular hypertension, Pt. on medications Rhythm:Regular Rate:Normal     Neuro/Psych    GI/Hepatic   Endo/Other    Renal/GU      Musculoskeletal   Abdominal   Peds  Hematology   Anesthesia Other Findings   Reproductive/Obstetrics                             Anesthesia Physical Anesthesia Plan  ASA: I  Anesthesia Plan: General   Post-op Pain Management:    Induction: Intravenous  Airway Management Planned: LMA  Additional Equipment:   Intra-op Plan:   Post-operative Plan: Extubation in OR  Informed Consent: I have reviewed the patients History and Physical, chart, labs and discussed the procedure including the risks, benefits and alternatives for the proposed anesthesia with the patient or authorized representative who has indicated his/her understanding and acceptance.   Dental advisory given  Plan Discussed with: CRNA, Anesthesiologist and Surgeon  Anesthesia Plan Comments:         Anesthesia Quick Evaluation  

## 2015-08-17 NOTE — Op Note (Signed)
08/17/2015  9:24 AM  PATIENT:  Erin Bowers  50 y.o. female  PRE-OPERATIVE DIAGNOSIS:  LEFT BREAST DCIS  POST-OPERATIVE DIAGNOSIS:  LEFT BREAST DCIS  PROCEDURE:  Procedure(s): RE-EXCISION OF BREAST CANCER,SUPERIOR MARGINS (Left)  SURGEON:  Surgeon(s) and Role:    * Jovita Kussmaul, MD - Primary  PHYSICIAN ASSISTANT:   ASSISTANTS: none   ANESTHESIA:   general  EBL:  Total I/O In: 200 [I.V.:200] Out: -   BLOOD ADMINISTERED:none  DRAINS: none   LOCAL MEDICATIONS USED:  MARCAINE     SPECIMEN:  Source of Specimen:  left breast superior margin and additional superomedial margin  DISPOSITION OF SPECIMEN:  PATHOLOGY  COUNTS:  YES  TOURNIQUET:  * No tourniquets in log *  DICTATION: .Dragon Dictation   After informed consent was obtained the patient was brought to the operating room and placed in the supine position on the operating table. After adequate induction of general anesthesia the patient's left breast was prepped with ChloraPrep, allowed to dry, and draped in usual sterile manner. An appropriate timeout was performed. The area around the old incision was infiltrated with quarter percent Marcaine. The old incision was then opened sharply with a 15 blade knife until the seroma cavity was entered. The superior margin was excised sharply with the electrocautery. Once the specimen was removed it was oriented with the appropriate paint colors. A specimen radiograph was obtained and I did notice some small faint calcifications at the superomedial margin. Because of this I took an additional superomedial margin. This was also marked with the appropriate paint color on the new true surgical margin superiorly. Hemostasis was then achieved using the Bovie electrocautery. The wound was irrigated with saline and infiltrated with more 0.25 percent Marcaine. The deep layer of the wound was then closed with interrupted 3-0 Vicryl stitches. The skin was then closed with interrupted 4-0  Monocryl subcuticular stitches. Dermabond dressings were applied. The patient tolerated the procedure well. At the end of the case all needle sponge and instrument counts were correct. The patient was then awakened and taken to recovery in stable condition.  PLAN OF CARE: Discharge to home after PACU  PATIENT DISPOSITION:  PACU - hemodynamically stable.   Delay start of Pharmacological VTE agent (>24hrs) due to surgical blood loss or risk of bleeding: not applicable

## 2015-08-17 NOTE — Transfer of Care (Signed)
Immediate Anesthesia Transfer of Care Note  Patient: Erin Bowers  Procedure(s) Performed: Procedure(s): RE-EXCISION OF BREAST CANCER,SUPERIOR MARGINS (Left)  Patient Location: PACU  Anesthesia Type:General  Level of Consciousness: sedated  Airway & Oxygen Therapy: Patient Spontanous Breathing and Patient connected to face mask oxygen  Post-op Assessment: Report given to RN and Post -op Vital signs reviewed and stable  Post vital signs: Reviewed and stable  Last Vitals:  Filed Vitals:   08/17/15 0931 08/17/15 0932  BP:  131/75  Pulse: 111 106  Temp:  36.5 C  Resp:  15    Complications: No apparent anesthesia complications

## 2015-08-17 NOTE — Anesthesia Procedure Notes (Signed)
Procedure Name: LMA Insertion Date/Time: 08/17/2015 8:38 AM Performed by: Maryella Shivers Pre-anesthesia Checklist: Patient identified, Emergency Drugs available, Suction available and Patient being monitored Patient Re-evaluated:Patient Re-evaluated prior to inductionOxygen Delivery Method: Circle System Utilized Preoxygenation: Pre-oxygenation with 100% oxygen Intubation Type: IV induction Ventilation: Mask ventilation without difficulty LMA: LMA inserted LMA Size: 4.0 Number of attempts: 1 Airway Equipment and Method: Bite block Placement Confirmation: positive ETCO2 Tube secured with: Tape Dental Injury: Teeth and Oropharynx as per pre-operative assessment

## 2015-08-20 ENCOUNTER — Encounter (HOSPITAL_BASED_OUTPATIENT_CLINIC_OR_DEPARTMENT_OTHER): Payer: Self-pay | Admitting: General Surgery

## 2015-08-22 ENCOUNTER — Ambulatory Visit
Admission: RE | Admit: 2015-08-22 | Discharge: 2015-08-22 | Disposition: A | Discharge status: Home / Self Care | Payer: 59 | Source: Ambulatory Visit | Attending: Radiation Oncology | Admitting: Radiation Oncology

## 2015-08-22 ENCOUNTER — Encounter: Payer: Self-pay | Admitting: Radiation Oncology

## 2015-08-22 VITALS — BP 141/79 | HR 75 | Temp 98.7°F | Resp 18 | Ht 64.0 in | Wt 179.5 lb

## 2015-08-22 DIAGNOSIS — C50212 Malignant neoplasm of upper-inner quadrant of left female breast: Secondary | ICD-10-CM | POA: Diagnosis not present

## 2015-08-22 DIAGNOSIS — Z9889 Other specified postprocedural states: Secondary | ICD-10-CM | POA: Diagnosis not present

## 2015-08-22 DIAGNOSIS — Z510 Encounter for antineoplastic radiation therapy: Secondary | ICD-10-CM | POA: Diagnosis not present

## 2015-08-22 NOTE — Progress Notes (Signed)
Location of Breast Cancer:Breast Cancer Upper Inner Quadrant Left Breast  Histology per Pathology Report Diagnosis   08-03-15 1. Breast, lumpectomy, left - INTERMEDIATE TO HIGH GRADE DUCTAL CARCINOMA IN SITU WITH COMEDONECROSIS AND CALCIFICATIONS. - DUCTAL CARCINOMA IN SITU IS FOCALLY 0.2 TO 0.3 CM TO POSTERIOR MARGIN. - DUCTAL CARCINOMA IN SITU CLOSELY APPROACHES SUPERIOR MARGIN AND FOCAL INVOLVEMENT CANNOT BE EXCLUDED (PLEASE SEE SPECIMEN # 2 AND # 3 FOR FINAL SUPERIOR MARGIN STATUS). - OTHER MARGINS ARE NEGATIVE. - SEE ONCOLOGY TEMPLATE. 2. Breast, excision, left superior lateral margin - DUCTAL CARCINOMA IN SITU WITH COMEDONECROSIS. - DUCTAL CARCINOMA IN SITU IS BROADLY LESS THAN 0.1 CM TO NEW SUPERIOR LATERAL MARGIN. - SEE ONCOLOGY TEMPLATE. 3. Breast, excision, left superior medial margin - DUCTAL CARCINOMA IN SITU WITH COMEDONECROSIS AND CALCIFICATIONS. - NEW SUPERIOR MEDIAL MARGIN IS FOCALLY POSITIVE FOR DUCTAL CARCINOMA IN SITU. - SEE ONCOLOGY TEMPLATE. Microscopic Comment BREAST, IN SITU CARCINOMA  Receptor Status: ER(100%), PR (15%), Her2-neu ()   Erin Bowers presented with calcification found in her left breast on screening mammography 06-25-15   Past/Anticipated interventions by surgeon, if any: Dr. Marlou Starks BREAST LUMPECTOMY WITH BRACKETED NEEDLE LOCALIZATION  Past/Anticipated interventions by medical oncology, if any: Dr. Lindi Adie 06-27-15 Left breast biopsy 1.Breast conserving surgery 2. Followed by adjuvant radiation therapy (May need post op Mammogram) 3. Followed by antiestrogen therapy with tamoxifen 5 years  Lymphedema issues, if any: No Pain issues, if any: 1/10 Has Oxycodone for pain control  SAFETY ISSUES:  Prior radiation? :No  Pacemaker/ICD? : No   Possible current pregnancy? No  Is the patient on methotrexate? : No  Current Complaints / other details:  Here to have discussion about radiation treatment plan  Menarched at early age of 36   Had no  pregnancies  Has not received birth control pills  Was never exposed to fertility medications or hormone replacement therapy.   Has no family history of Breast/GYN/GI cancer BP 141/79 mmHg  Pulse 75  Temp(Src) 98.7 F (37.1 C) (Oral)  Resp 18  Ht _0  (1.626 m)  Wt 179 lb 8 oz (81.421 kg)  BMI 30.80 kg/m2  SpO2 100%  Erin Spurling, RN 08/22/2015,2:22 PM

## 2015-08-22 NOTE — Progress Notes (Signed)
Radiation Oncology         [phone]) [phone] ________________________________  Follow up Visit Note - Date: 08/22/2015   Name: Erin Bowers MRN: [id number]   DOB: 1965-09-06  REFERRING PHYSICIAN: Autumn Messing III, MD  DIAGNOSIS AND STAGE: Breast cancer of upper-inner quadrant of left female breast [facility])   Staging form: Breast, AJCC 7th Edition     Clinical stage from 07/04/2015: Stage 0 (Tis (DCIS), N0, M0) - Unsigned       Staging comments: Staged at breast conference on 1.25.17  HISTORY OF PRESENT ILLNESS::Erin Bowers is a 50 y.o. female with Stage 0 (Tis (DCIS), N0, M0) breast cancer of the upper-inner quadrant of the left female breast. She is here for a follow up visit prior to radiation treatment initiation. I initially met with her on 07/04/2015 in [facility]. She had her lumpectomy on 08/03/2015. This showed intermediate to high grade DCIS measuring about 3 cm. Margins were close and possibly positive so she underwent re-excision on 08/17/2015 which showed a small focus of residual high grade DCIS. All margins were negative. She is recovering well from surgery and presents today to discuss radiation in the management of her disease.  Today, she reports pain at a 1/10 and is using Oxycodone for pain control. She denies any lymphedema issues at this time. She is scheduled to see Dr. Marlou Starks on 02/29/2017. She is also scheduled to see Dr. Lindi Adie tomorrow on 08/23/2015.  OBGYN HISTORY: Menarched at early age of 89  Had no pregnancies  Has not received birth control pills  Was never exposed to fertility medications or hormone replacement therapy.  Has no family history of Breast/GYN/GI cancer  PREVIOUS RADIATION THERAPY: No  Past medical, social and family history were reviewed in the electronic chart. Review of symptoms was reviewed in the electronic chart. Medications were reviewed in the electronic chart.   PHYSICAL EXAM: BP 141/79 mmHg  Pulse 75  Temp(Src) 98.7 F  (37.1 C) (Oral)  Resp 18  Ht 5' 4"  (1.626 m)  Wt 179 lb 8 oz (81.421 kg)  BMI 30.80 kg/m2  SpO2 100%   Healing incision in the superior aspect of the left breast.   IMPRESSION: Erin Bowers is a 50 y.o. female with Stage 0 (Tis (DCIS), N0, M0) breast cancer of the upper-inner quadrant of the left female breast.  PLAN: I spoke to the patient today regarding her diagnosis and options for treatment. We discussed the equivalence in terms of survival and local failure between mastectomy and breast conservation. We discussed the role of radiation in decreasing local failures in patients who undergo lumpectomy. We discussed the process of simulation and the placement tattoos. We discussed 4-6 weeks of treatment as an outpatient. We discussed the possibility of asymptomatic lung damage. We discussed the low likelihood of secondary malignancies. We discussed the possible side effects including but not limited to skin redness, fatigue, permanent skin darkening, and breast swelling.  We discussed the use of cardiac sparing with deep inspiration breath hold if needed. We will need a pre-radiation mammogram. CT simulation has been scheduled for 09/11/2015 at 3:30 pm.    I spent 20 minutes face to face with the patient and more than 50% of that time was spent in counseling and/or coordination of care.   ------------------------------------------------  Thea Silversmith, MD  This document serves as a record of services personally performed by Thea Silversmith, MD. It was created on her behalf by Jenell Milliner, a trained medical  scribe. The creation of this record is based on the scribe's personal observations and the provider's statements to them. This document has been checked and approved by the attending provider.

## 2015-08-23 ENCOUNTER — Encounter: Payer: Self-pay | Admitting: Hematology and Oncology

## 2015-08-23 ENCOUNTER — Telehealth: Payer: Self-pay | Admitting: Hematology and Oncology

## 2015-08-23 ENCOUNTER — Ambulatory Visit (HOSPITAL_BASED_OUTPATIENT_CLINIC_OR_DEPARTMENT_OTHER): Payer: 59 | Admitting: Hematology and Oncology

## 2015-08-23 VITALS — BP 139/79 | HR 64 | Temp 98.1°F | Resp 18 | Ht 64.0 in | Wt 179.9 lb

## 2015-08-23 DIAGNOSIS — C50212 Malignant neoplasm of upper-inner quadrant of left female breast: Secondary | ICD-10-CM

## 2015-08-23 MED ORDER — TAMOXIFEN CITRATE 20 MG PO TABS: 20.0000 mg | ORAL_TABLET | Freq: Every day | ORAL | Status: DC

## 2015-08-23 NOTE — Telephone Encounter (Signed)
appt made and avs printed °

## 2015-08-23 NOTE — Assessment & Plan Note (Signed)
Left lumpectomy 08/03/2015 : Intermediate to high-grade DCIS with comedonecrosis, DCIS close superior margin, excision left superior margin DCIS less than 0.1 cm, additional superior margin still showed DCIS focally positive, ER 100%, PR 15% Reexcision of margins 08/17/2015: 0.2 cm residual DCIS but final margins negative Pathologic stage: Tis N0 stage 0  Recommendation: 1. Adjuvant radiation therapy 2. Followed by antiestrogen therapy with tamoxifen 5 years  I discussed the final pathology report of the patient provided her with a copy of this result. I discussed the importance of grade and comedonecrosis as well as margins.  Prognosis: Based upon [facility] nomogram   Return to clinic after radiation therapy is complete to start tamoxifen.

## 2015-08-23 NOTE — Progress Notes (Signed)
Patient Care Team: Nanci Pina, FNP as PCP - General (Nurse Practitioner) Autumn Messing III, MD as Consulting Physician (General Surgery) Nicholas Lose, MD as Consulting Physician (Hematology and Oncology) Thea Silversmith, MD as Consulting Physician (Radiation Oncology) Sylvan Cheese, NP as Nurse Practitioner (Hematology and Oncology)  DIAGNOSIS: Breast cancer of upper-inner quadrant of left female breast [facility])   Staging form: Breast, AJCC 7th Edition     Clinical stage from 07/04/2015: Stage 0 (Tis (DCIS), N0, M0) - Unsigned       Staging comments: Staged at breast conference on 1.25.17  SUMMARY OF ONCOLOGIC HISTORY:   Breast cancer of upper-inner quadrant of left female breast ([place])   06/25/2015 Mammogram Screening detected left breast calcifications posterior depth 4.4 x 3.3 x 2.3 cm   06/27/2015 Initial Diagnosis Left breast biopsy upper quadrant: DCIS with comedo necrosis and calcification, grade 2-3, ER 100%, PR 15%, Tis N0 stage 0 clinical stage   07/26/2015 Surgery Left lumpectomy: Intermediate to high-grade DCIS with comedonecrosis, DCIS close superior margin, excision left superior margin DCIS less than 0.1 cm, additional superior margin still showed DCIS focally positive, ER 100%, PR 15%   08/17/2015 Surgery Left superior margin excision: Less than 0.2 cm residual high-grade DCIS; left additional superior/medial margin: Benign    CHIEF COMPLIANT: Follow-up after recent breast surgeries  INTERVAL HISTORY: Erin Bowers is a 50 year old with above-mentioned history of left breast DCIS who underwent left lumpectomy in 07/26/2015. She had positive margins and underwent a repeat surgery for the margins on 08/17/2015. The final margins are negative. There was no evidence of invasive breast cancer. She is recovering very well from the surgeries.  REVIEW OF SYSTEMS:   Constitutional: Denies fevers, chills or abnormal weight loss Eyes: Denies blurriness of vision Ears, nose,  mouth, throat, and face: Denies mucositis or sore throat Respiratory: Denies cough, dyspnea or wheezes Cardiovascular: Denies palpitation, chest discomfort Gastrointestinal:  Denies nausea, heartburn or change in bowel habits Skin: Denies abnormal skin rashes Lymphatics: Denies new lymphadenopathy or easy bruising Neurological:Denies numbness, tingling or new weaknesses Behavioral/Psych: Mood is stable, no new changes  Extremities: No lower extremity edema Breast: Discomfort from recent left breast surgery All other systems were reviewed with the patient and are negative.  I have reviewed the past medical history, past surgical history, social history and family history with the patient and they are unchanged from previous note.  ALLERGIES:  is allergic to adhesive.  MEDICATIONS:  Current Outpatient Prescriptions  Medication Sig Dispense Refill  . Ascorbic Acid (VITAMIN C) 1000 MG tablet Take 1,000 mg by mouth daily.    . Cholecalciferol (D3-1000) 1000 units capsule Take 1,000 Units by mouth daily.    . Multiple Vitamin (MULTIVITAMIN) tablet Take 1 tablet by mouth daily.    Marland Kitchen olmesartan-hydrochlorothiazide (BENICAR HCT) 20-12.5 MG tablet   3  . oxyCODONE-acetaminophen (ROXICET) 5-325 MG tablet Take 1-2 tablets by mouth every 4 (four) hours as needed. 50 tablet 0  . oxyCODONE-acetaminophen (ROXICET) 5-325 MG tablet Take 1-2 tablets by mouth every 4 (four) hours as needed. 50 tablet 0  . pyridOXINE (VITAMIN B-6) 100 MG tablet Take 100 mg by mouth daily.    Marland Kitchen UNABLE TO FIND Take 500 mg by mouth daily. Mega Red    . vitamin B-12 (CYANOCOBALAMIN) 1000 MCG tablet Take 1,000 mcg by mouth daily.     No current facility-administered medications for this visit.    PHYSICAL EXAMINATION: ECOG PERFORMANCE STATUS: 1 - Symptomatic but completely ambulatory  Filed Vitals:   08/23/15 1518  BP: 139/79  Pulse: 64  Temp: 98.1 F (36.7 C)  Resp: 18   Filed Weights   08/23/15 1518  Weight: 179  lb 14.4 oz (81.602 kg)    GENERAL:alert, no distress and comfortable SKIN: skin color, texture, turgor are normal, no rashes or significant lesions EYES: normal, Conjunctiva are pink and non-injected, sclera clear OROPHARYNX:no exudate, no erythema and lips, buccal mucosa, and tongue normal  NECK: supple, thyroid normal size, non-tender, without nodularity LYMPH:  no palpable lymphadenopathy in the cervical, axillary or inguinal LUNGS: clear to auscultation and percussion with normal breathing effort HEART: regular rate & rhythm and no murmurs and no lower extremity edema ABDOMEN:abdomen soft, non-tender and normal bowel sounds MUSCULOSKELETAL:no cyanosis of digits and no clubbing  NEURO: alert & oriented x 3 with fluent speech, no focal motor/sensory deficits EXTREMITIES: No lower extremity edema  LABORATORY DATA:  I have reviewed the data as listed   Chemistry      Component Value Date/Time   NA 143 07/04/2015 0849   K 3.7 07/04/2015 0849   CO2 30* 07/04/2015 0849   BUN 15.9 07/04/2015 0849   CREATININE 0.8 07/04/2015 0849      Component Value Date/Time   CALCIUM 10.1 07/04/2015 0849   ALKPHOS 73 07/04/2015 0849   AST 19 07/04/2015 0849   ALT 20 07/04/2015 0849   BILITOT 0.33 07/04/2015 0849       Lab Results  Component Value Date   WBC 5.6 07/04/2015   HGB 12.8 07/04/2015   HCT 38.3 07/04/2015   MCV 81.2 07/04/2015   PLT 289 07/04/2015   NEUTROABS 2.5 07/04/2015   ASSESSMENT & PLAN:  Breast cancer of upper-inner quadrant of left female breast ([place]) Left lumpectomy 08/03/2015 : Intermediate to high-grade DCIS with comedonecrosis, DCIS close superior margin, excision left superior margin DCIS less than 0.1 cm, additional superior margin still showed DCIS focally positive, ER 100%, PR 15% Reexcision of margins 08/17/2015: 0.2 cm residual DCIS but final margins negative Pathologic stage: Tis N0 stage 0  Recommendation: 1. Adjuvant radiation therapy 2. Followed by  antiestrogen therapy with tamoxifen 5 years  I discussed the final pathology report of the patient provided her with a copy of this result. I discussed the importance of grade and comedonecrosis as well as margins.  Prognosis: Based upon [facility] nomogram Her 10 year risk of recurrence with 17% and 5 years was at 11%. With combination of radiation and antiestrogen therapy with tamoxifen, high risk of recurrence. Used to 3% over 10 years and 2% or 5 years.  Return to clinic after radiation therapy is complete to start tamoxifen. I gave her a prescription for tamoxifen which she will start around mid June. I will see her back in mid July for follow-up.    No orders of the defined types were placed in this encounter.   The patient has a good understanding of the overall plan. she agrees with it. she will call with any problems that may develop before the next visit here.   Rulon Eisenmenger, MD 08/23/2015

## 2015-09-10 ENCOUNTER — Ambulatory Visit
Admission: RE | Admit: 2015-09-10 | Discharge: 2015-09-10 | Disposition: A | Discharge status: Home / Self Care | Payer: 59 | Source: Ambulatory Visit | Attending: Radiation Oncology | Admitting: Radiation Oncology

## 2015-09-10 DIAGNOSIS — C50212 Malignant neoplasm of upper-inner quadrant of left female breast: Secondary | ICD-10-CM

## 2015-09-10 DIAGNOSIS — R928 Other abnormal and inconclusive findings on diagnostic imaging of breast: Secondary | ICD-10-CM | POA: Diagnosis not present

## 2015-09-11 ENCOUNTER — Ambulatory Visit
Admission: RE | Admit: 2015-09-11 | Discharge: 2015-09-11 | Disposition: A | Discharge status: Home / Self Care | Payer: 59 | Source: Ambulatory Visit | Attending: Radiation Oncology | Admitting: Radiation Oncology

## 2015-09-11 DIAGNOSIS — C50212 Malignant neoplasm of upper-inner quadrant of left female breast: Secondary | ICD-10-CM

## 2015-09-11 DIAGNOSIS — Z9889 Other specified postprocedural states: Secondary | ICD-10-CM | POA: Diagnosis not present

## 2015-09-11 DIAGNOSIS — Z510 Encounter for antineoplastic radiation therapy: Secondary | ICD-10-CM | POA: Diagnosis not present

## 2015-09-11 NOTE — Progress Notes (Signed)
Radiation Oncology         [phone] ________________________________  Name: Erin Bowers      MRN: [id number]          Date: 09/11/2015              DOB: Apr 26, 1966  Optical Surface Tracking Plan:  Since intensity modulated radiotherapy (IMRT) and 3D conformal radiation treatment methods are predicated on accurate and precise positioning for treatment, intrafraction motion monitoring is medically necessary to ensure accurate and safe treatment delivery.  The ability to quantify intrafraction motion without excessive ionizing radiation dose can only be performed with optical surface tracking. Accordingly, surface imaging offers the opportunity to obtain 3D measurements of patient position throughout IMRT and 3D treatments without excessive radiation exposure.  I am ordering optical surface tracking for this patient's upcoming course of radiotherapy. ________________________________ Signature   Reference:   Ursula Alert, J, et al. Surface imaging-based analysis of intrafraction motion for breast radiotherapy patients.Journal of [place], n. 6, nov. 2014. ISSN [id number].   Available at: <http://www.jacmp.org/index.php/jacmp/article/view/4957>.

## 2015-09-11 NOTE — Progress Notes (Signed)
Name: Erin Bowers   MRN: [id number]  Date:  09/11/2015  DOB: 31-May-1966  Status:outpatient   DIAGNOSIS: Left Breast cancer.  CONSENT VERIFIED: yes SET UP: Patient is setup supine  IMMOBILIZATION:  The following immobilization was used:Custom Moldable Pillow, breast board.  NARRATIVE: Ms. Erin Bowers was brought to the [facility].  Identity was confirmed.  All relevant records and images related to the planned course of therapy were reviewed.  Then, the patient was positioned in a stable reproducible clinical set-up for radiation therapy.  Wires were placed to delineate the clinical extent of breast tissue. A wire was placed on the scar as well.  CT images were obtained.  An isocenter was placed. Skin markings were placed.  The position of the heart was then analyzed.  Due to the proximity of the heart to the chest wall, I felt she would benefit from deep inspiration breath hold for cardiac sparing.  She was then coached and rescanned in the breath hold position.  Acceptable cardiac sparing was achieved. The CT images were loaded into the planning software where the target and avoidance structures were contoured.  The radiation prescription was entered and confirmed. The patient was discharged in stable condition and tolerated simulation well.    TREATMENT PLANNING NOTE/3D Simulation Note Treatment planning then occurred. I have requested : MLC's, isodose plan, basic dose calculation  3D simulation was performed.  I personally designed and supervised the construction of 3 medically necessary complex treatment devices in the form of MLCs which will be used for beam modification and to protect critical structures including the heart and lung as well as the immobilization device which is necessary for reproducible set up.  I have requested a dose volume histogram of the heart, lung and tumor cavity.   Special treatment procedure was performed today due to the extra time and effort required  by myself to plan and prepare this patient for deep inspiration breath hold technique.  I have determined cardiac sparing to be of benefit to this patient to prevent long term cardiac damage due to radiation of the heart.  Bellows were placed on the patient's abdomen. To facilitate cardiac sparing, the patient was coached by the radiation therapists on breath hold techniques and breathing practice was performed. Practice waveforms were obtained. The patient was then scanned while maintaining breath hold in the treatment position.  This image was then transferred over to the imaging specialist. The imaging specialist then created a fusion of the free breathing and breath hold scans using the chest wall as the stable structure. I personally reviewed the fusion in axial, coronal and sagittal image planes.  Excellent cardiac sparing was obtained.  I felt the patient is an appropriate candidate for breath hold and the patient will be treated as such.  The image fusion was then reviewed with the patient to reinforce the necessity of reproducible breath hold.

## 2015-09-13 MED FILL — OLMESARTAN-HCTZ 20-12.5 MG: 20-12.5 | 30 days supply | Qty: 30 | Fill #0

## 2015-09-17 MED FILL — TAMOXIFEN 20 MG TABLET: 20 | 90 days supply | Qty: 90 | Fill #0

## 2015-09-18 DIAGNOSIS — C50212 Malignant neoplasm of upper-inner quadrant of left female breast: Secondary | ICD-10-CM | POA: Diagnosis not present

## 2015-09-18 DIAGNOSIS — Z9889 Other specified postprocedural states: Secondary | ICD-10-CM | POA: Diagnosis not present

## 2015-09-18 DIAGNOSIS — Z510 Encounter for antineoplastic radiation therapy: Secondary | ICD-10-CM | POA: Diagnosis not present

## 2015-09-21 DIAGNOSIS — C50212 Malignant neoplasm of upper-inner quadrant of left female breast: Secondary | ICD-10-CM | POA: Diagnosis not present

## 2015-09-25 ENCOUNTER — Ambulatory Visit
Admission: RE | Admit: 2015-09-25 | Discharge: 2015-09-25 | Disposition: A | Discharge status: Home / Self Care | Payer: 59 | Source: Ambulatory Visit | Attending: Radiation Oncology | Admitting: Radiation Oncology

## 2015-09-25 DIAGNOSIS — Z510 Encounter for antineoplastic radiation therapy: Secondary | ICD-10-CM | POA: Diagnosis not present

## 2015-09-25 DIAGNOSIS — Z9889 Other specified postprocedural states: Secondary | ICD-10-CM | POA: Diagnosis not present

## 2015-09-25 DIAGNOSIS — C50212 Malignant neoplasm of upper-inner quadrant of left female breast: Secondary | ICD-10-CM | POA: Diagnosis not present

## 2015-09-26 ENCOUNTER — Ambulatory Visit
Admission: RE | Admit: 2015-09-26 | Discharge: 2015-09-26 | Disposition: A | Discharge status: Home / Self Care | Payer: 59 | Source: Ambulatory Visit | Attending: Radiation Oncology | Admitting: Radiation Oncology

## 2015-09-26 DIAGNOSIS — Z9889 Other specified postprocedural states: Secondary | ICD-10-CM | POA: Diagnosis not present

## 2015-09-26 DIAGNOSIS — Z510 Encounter for antineoplastic radiation therapy: Secondary | ICD-10-CM | POA: Diagnosis not present

## 2015-09-26 DIAGNOSIS — C50212 Malignant neoplasm of upper-inner quadrant of left female breast: Secondary | ICD-10-CM | POA: Diagnosis not present

## 2015-09-27 ENCOUNTER — Ambulatory Visit
Admission: RE | Admit: 2015-09-27 | Discharge: 2015-09-27 | Disposition: A | Discharge status: Home / Self Care | Payer: 59 | Source: Ambulatory Visit | Attending: Radiation Oncology | Admitting: Radiation Oncology

## 2015-09-27 DIAGNOSIS — Z9889 Other specified postprocedural states: Secondary | ICD-10-CM | POA: Diagnosis not present

## 2015-09-27 DIAGNOSIS — Z510 Encounter for antineoplastic radiation therapy: Secondary | ICD-10-CM | POA: Diagnosis not present

## 2015-09-27 DIAGNOSIS — C50212 Malignant neoplasm of upper-inner quadrant of left female breast: Secondary | ICD-10-CM | POA: Diagnosis not present

## 2015-09-28 ENCOUNTER — Ambulatory Visit
Admission: RE | Admit: 2015-09-28 | Discharge: 2015-09-28 | Disposition: A | Discharge status: Home / Self Care | Payer: 59 | Source: Ambulatory Visit | Attending: Radiation Oncology | Admitting: Radiation Oncology

## 2015-09-28 DIAGNOSIS — Z510 Encounter for antineoplastic radiation therapy: Secondary | ICD-10-CM | POA: Diagnosis not present

## 2015-09-28 DIAGNOSIS — Z9889 Other specified postprocedural states: Secondary | ICD-10-CM | POA: Diagnosis not present

## 2015-09-28 DIAGNOSIS — C50212 Malignant neoplasm of upper-inner quadrant of left female breast: Secondary | ICD-10-CM | POA: Diagnosis not present

## 2015-10-01 ENCOUNTER — Ambulatory Visit
Admission: RE | Admit: 2015-10-01 | Discharge: 2015-10-01 | Disposition: A | Discharge status: Home / Self Care | Payer: 59 | Source: Ambulatory Visit | Attending: Radiation Oncology | Admitting: Radiation Oncology

## 2015-10-01 DIAGNOSIS — C50212 Malignant neoplasm of upper-inner quadrant of left female breast: Secondary | ICD-10-CM | POA: Diagnosis not present

## 2015-10-01 DIAGNOSIS — Z9889 Other specified postprocedural states: Secondary | ICD-10-CM | POA: Diagnosis not present

## 2015-10-01 DIAGNOSIS — Z510 Encounter for antineoplastic radiation therapy: Secondary | ICD-10-CM | POA: Diagnosis not present

## 2015-10-02 ENCOUNTER — Ambulatory Visit
Admission: RE | Admit: 2015-10-02 | Discharge: 2015-10-02 | Disposition: A | Discharge status: Home / Self Care | Payer: 59 | Source: Ambulatory Visit | Attending: Radiation Oncology | Admitting: Radiation Oncology

## 2015-10-02 ENCOUNTER — Encounter: Payer: Self-pay | Admitting: Radiation Oncology

## 2015-10-02 ENCOUNTER — Inpatient Hospital Stay
Admission: RE | Admit: 2015-10-02 | Discharge: 2015-10-02 | Disposition: A | Discharge status: Home / Self Care | Payer: Self-pay | Source: Ambulatory Visit | Attending: Radiation Oncology | Admitting: Radiation Oncology

## 2015-10-02 ENCOUNTER — Inpatient Hospital Stay: Admission: RE | Admit: 2015-10-02 | Payer: Self-pay | Source: Ambulatory Visit | Admitting: Radiation Oncology

## 2015-10-02 VITALS — BP 141/80 | HR 62 | Temp 98.7°F | Ht 64.0 in | Wt 178.0 lb

## 2015-10-02 DIAGNOSIS — C50212 Malignant neoplasm of upper-inner quadrant of left female breast: Secondary | ICD-10-CM

## 2015-10-02 DIAGNOSIS — Z9889 Other specified postprocedural states: Secondary | ICD-10-CM | POA: Diagnosis not present

## 2015-10-02 DIAGNOSIS — Z510 Encounter for antineoplastic radiation therapy: Secondary | ICD-10-CM | POA: Diagnosis not present

## 2015-10-02 MED ORDER — RADIAPLEXRX EX GEL
Freq: Once | CUTANEOUS | Status: AC
  Administered 2015-10-02: 19:00:00 via TOPICAL

## 2015-10-02 MED ORDER — ALRA NON-METALLIC DEODORANT (RAD-ONC)
1.0000 "application " | Freq: Once | TOPICAL | Status: AC
  Administered 2015-10-02: 1 via TOPICAL

## 2015-10-02 NOTE — Progress Notes (Signed)
Pt here for patient teaching.  Pt given Radiation and You booklet, skin care instructions, Alra deodorant and Radiaplex gel. Pt reports they have watched the Radiation Therapy Education video on October 02, 2015.  Reviewed areas of pertinence such as fatigue, skin changes, breast tenderness and breast swelling . Pt able to give teach back of to pat skin, use unscented/gentle soap and drink plenty of water,apply Radiaplex bid, avoid applying anything to skin within 4 hours of treatment, avoid wearing an under wire bra and to use an electric razor if they must shave. Pt demonstrated understanding of information given and will contact nursing with any questions or concerns.

## 2015-10-02 NOTE — Addendum Note (Signed)
Encounter addended by: Malena Edman, RN on: 10/02/2015  6:32 PM<BR>     Documentation filed: Dx [employer], Inpatient MAR, Orders

## 2015-10-02 NOTE — Progress Notes (Signed)
Erin Bowers has received 5 fractions to her left breast. Mild tanning noted.  Minimal pain.

## 2015-10-02 NOTE — Progress Notes (Signed)
Weekly Management Note Current Dose: 13.35  Gy  Projected Dose: 52.72 Gy   Narrative:  The patient presents for routine under treatment assessment.  CBCT/MVCT images/Port film x-rays were reviewed.  The chart was checked. Doing well. RN education performed.   Physical Findings: Weight:  . Unchanged  Impression:  The patient is tolerating radiation.  Plan:  Continue treatment as planned. Start radiaplex.

## 2015-10-03 ENCOUNTER — Ambulatory Visit
Admission: RE | Admit: 2015-10-03 | Discharge: 2015-10-03 | Disposition: A | Discharge status: Home / Self Care | Payer: 59 | Source: Ambulatory Visit | Attending: Radiation Oncology | Admitting: Radiation Oncology

## 2015-10-03 DIAGNOSIS — Z510 Encounter for antineoplastic radiation therapy: Secondary | ICD-10-CM | POA: Diagnosis not present

## 2015-10-03 DIAGNOSIS — C50212 Malignant neoplasm of upper-inner quadrant of left female breast: Secondary | ICD-10-CM | POA: Diagnosis not present

## 2015-10-03 DIAGNOSIS — Z9889 Other specified postprocedural states: Secondary | ICD-10-CM | POA: Diagnosis not present

## 2015-10-04 ENCOUNTER — Ambulatory Visit
Admission: RE | Admit: 2015-10-04 | Discharge: 2015-10-04 | Disposition: A | Discharge status: Home / Self Care | Payer: 59 | Source: Ambulatory Visit | Attending: Radiation Oncology | Admitting: Radiation Oncology

## 2015-10-04 ENCOUNTER — Telehealth: Payer: Self-pay | Admitting: *Deleted

## 2015-10-04 DIAGNOSIS — Z9889 Other specified postprocedural states: Secondary | ICD-10-CM | POA: Diagnosis not present

## 2015-10-04 DIAGNOSIS — Z510 Encounter for antineoplastic radiation therapy: Secondary | ICD-10-CM | POA: Diagnosis not present

## 2015-10-04 DIAGNOSIS — C50212 Malignant neoplasm of upper-inner quadrant of left female breast: Secondary | ICD-10-CM | POA: Diagnosis not present

## 2015-10-04 NOTE — Telephone Encounter (Signed)
  Oncology Nurse Navigator Documentation    Navigator Encounter Type: Telephone (10/04/15 1100) Telephone: Outgoing Call (10/04/15 1100)         Patient Visit Type: [id number] (10/04/15 1100) Treatment Phase: First Radiation Tx (10/04/15 1100) Barriers/Navigation Needs: No barriers at this time;No Questions;No Needs (10/04/15 1100)   Interventions: None required (10/04/15 1100)                      Time Spent with Patient: 15 (10/04/15 1100)

## 2015-10-05 ENCOUNTER — Ambulatory Visit
Admission: RE | Admit: 2015-10-05 | Discharge: 2015-10-05 | Disposition: A | Discharge status: Home / Self Care | Payer: 59 | Source: Ambulatory Visit | Attending: Radiation Oncology | Admitting: Radiation Oncology

## 2015-10-05 DIAGNOSIS — Z9889 Other specified postprocedural states: Secondary | ICD-10-CM | POA: Diagnosis not present

## 2015-10-05 DIAGNOSIS — Z510 Encounter for antineoplastic radiation therapy: Secondary | ICD-10-CM | POA: Diagnosis not present

## 2015-10-05 DIAGNOSIS — C50212 Malignant neoplasm of upper-inner quadrant of left female breast: Secondary | ICD-10-CM | POA: Diagnosis not present

## 2015-10-08 ENCOUNTER — Ambulatory Visit
Admission: RE | Admit: 2015-10-08 | Discharge: 2015-10-08 | Disposition: A | Discharge status: Home / Self Care | Payer: 59 | Source: Ambulatory Visit | Attending: Radiation Oncology | Admitting: Radiation Oncology

## 2015-10-08 DIAGNOSIS — Z9889 Other specified postprocedural states: Secondary | ICD-10-CM | POA: Diagnosis not present

## 2015-10-08 DIAGNOSIS — Z510 Encounter for antineoplastic radiation therapy: Secondary | ICD-10-CM | POA: Diagnosis not present

## 2015-10-08 DIAGNOSIS — C50212 Malignant neoplasm of upper-inner quadrant of left female breast: Secondary | ICD-10-CM | POA: Diagnosis not present

## 2015-10-09 ENCOUNTER — Ambulatory Visit: Payer: 59 | Admitting: Radiation Oncology

## 2015-10-09 ENCOUNTER — Encounter: Payer: Self-pay | Admitting: Radiation Oncology

## 2015-10-09 ENCOUNTER — Ambulatory Visit
Admission: RE | Admit: 2015-10-09 | Discharge: 2015-10-09 | Disposition: A | Discharge status: Home / Self Care | Payer: 59 | Source: Ambulatory Visit | Attending: Radiation Oncology | Admitting: Radiation Oncology

## 2015-10-09 VITALS — BP 115/82 | HR 88 | Temp 98.1°F | Ht 64.0 in | Wt 176.1 lb

## 2015-10-09 DIAGNOSIS — C50212 Malignant neoplasm of upper-inner quadrant of left female breast: Secondary | ICD-10-CM | POA: Diagnosis not present

## 2015-10-09 DIAGNOSIS — Z510 Encounter for antineoplastic radiation therapy: Secondary | ICD-10-CM | POA: Diagnosis not present

## 2015-10-09 DIAGNOSIS — Z9889 Other specified postprocedural states: Secondary | ICD-10-CM | POA: Diagnosis not present

## 2015-10-09 NOTE — Progress Notes (Signed)
Erin Bowers has received 10 fractions to her left breast.  Skin to left breast with tanning using Radiaplex gel bid.  Appetite is good.  Denies fatigue and pain. BP 115/82 mmHg  Pulse 88  Temp(Src) 98.1 F (36.7 C) (Oral)  Ht 5\' 4"  (1.626 m)  Wt 176 lb 1.6 oz (79.878 kg)  BMI 30.21 kg/m2  SpO2 99%

## 2015-10-09 NOTE — Progress Notes (Signed)
Weekly Management Note Current Dose: 26.7  Gy  Projected Dose: 52.72 Gy   Narrative:  The patient presents for routine under treatment assessment.  CBCT/MVCT images/Port film x-rays were reviewed.  The chart was checked. Erin Bowers has received 10 fractions to her left breast. Skin to left breast with tanning using Radiaplex gel bid. Appetite is good. Denies fatigue and pain. She was curious when her skin color would return to normal.  Physical Findings: Weight: 176 lb 1.6 oz (79.878 kg). Slight hyperpigmentation of the left breast.  Impression:  The patient is tolerating radiation.  Plan:  Continue treatment as planned. Continue radiaplex. Discussed time of skin changes.   ------------------------------------------------  Thea Silversmith, MD  This document serves as a record of services personally performed by Thea Silversmith, MD. It was created on her behalf by Arlyce Harman, a trained medical scribe. The creation of this record is based on the scribe's personal observations and the provider's statements to them. This document has been checked and approved by the attending provider.

## 2015-10-10 ENCOUNTER — Ambulatory Visit
Admission: RE | Admit: 2015-10-10 | Discharge: 2015-10-10 | Disposition: A | Discharge status: Home / Self Care | Payer: 59 | Source: Ambulatory Visit | Attending: Radiation Oncology | Admitting: Radiation Oncology

## 2015-10-10 DIAGNOSIS — Z510 Encounter for antineoplastic radiation therapy: Secondary | ICD-10-CM | POA: Diagnosis not present

## 2015-10-10 DIAGNOSIS — Z9889 Other specified postprocedural states: Secondary | ICD-10-CM | POA: Diagnosis not present

## 2015-10-10 DIAGNOSIS — C50212 Malignant neoplasm of upper-inner quadrant of left female breast: Secondary | ICD-10-CM | POA: Diagnosis not present

## 2015-10-11 ENCOUNTER — Ambulatory Visit
Admission: RE | Admit: 2015-10-11 | Discharge: 2015-10-11 | Disposition: A | Discharge status: Home / Self Care | Payer: 59 | Source: Ambulatory Visit | Attending: Radiation Oncology | Admitting: Radiation Oncology

## 2015-10-11 DIAGNOSIS — Z9889 Other specified postprocedural states: Secondary | ICD-10-CM | POA: Diagnosis not present

## 2015-10-11 DIAGNOSIS — C50212 Malignant neoplasm of upper-inner quadrant of left female breast: Secondary | ICD-10-CM | POA: Diagnosis not present

## 2015-10-11 DIAGNOSIS — Z510 Encounter for antineoplastic radiation therapy: Secondary | ICD-10-CM | POA: Diagnosis not present

## 2015-10-12 ENCOUNTER — Ambulatory Visit
Admission: RE | Admit: 2015-10-12 | Discharge: 2015-10-12 | Disposition: A | Discharge status: Home / Self Care | Payer: 59 | Source: Ambulatory Visit | Attending: Radiation Oncology | Admitting: Radiation Oncology

## 2015-10-12 DIAGNOSIS — C50212 Malignant neoplasm of upper-inner quadrant of left female breast: Secondary | ICD-10-CM | POA: Diagnosis not present

## 2015-10-12 DIAGNOSIS — Z9889 Other specified postprocedural states: Secondary | ICD-10-CM | POA: Diagnosis not present

## 2015-10-12 DIAGNOSIS — Z510 Encounter for antineoplastic radiation therapy: Secondary | ICD-10-CM | POA: Diagnosis not present

## 2015-10-15 ENCOUNTER — Ambulatory Visit
Admission: RE | Admit: 2015-10-15 | Discharge: 2015-10-15 | Disposition: A | Discharge status: Home / Self Care | Payer: 59 | Source: Ambulatory Visit | Attending: Radiation Oncology | Admitting: Radiation Oncology

## 2015-10-15 DIAGNOSIS — C50212 Malignant neoplasm of upper-inner quadrant of left female breast: Secondary | ICD-10-CM | POA: Diagnosis not present

## 2015-10-15 DIAGNOSIS — Z9889 Other specified postprocedural states: Secondary | ICD-10-CM | POA: Diagnosis not present

## 2015-10-15 DIAGNOSIS — Z510 Encounter for antineoplastic radiation therapy: Secondary | ICD-10-CM | POA: Diagnosis not present

## 2015-10-16 ENCOUNTER — Ambulatory Visit
Admission: RE | Admit: 2015-10-16 | Discharge: 2015-10-16 | Disposition: A | Discharge status: Home / Self Care | Payer: 59 | Source: Ambulatory Visit | Attending: Radiation Oncology | Admitting: Radiation Oncology

## 2015-10-16 DIAGNOSIS — Z510 Encounter for antineoplastic radiation therapy: Secondary | ICD-10-CM | POA: Diagnosis not present

## 2015-10-16 DIAGNOSIS — C50212 Malignant neoplasm of upper-inner quadrant of left female breast: Secondary | ICD-10-CM | POA: Diagnosis not present

## 2015-10-16 DIAGNOSIS — Z9889 Other specified postprocedural states: Secondary | ICD-10-CM | POA: Diagnosis not present

## 2015-10-16 NOTE — Progress Notes (Signed)
Weekly Management Note Current Dose: 40.05  Gy  Projected Dose: 52.72 Gy   Narrative:  The patient presents for routine under treatment assessment.  CBCT/MVCT images/Port film x-rays were reviewed.  The chart was checked. She is doing well. She mentions she has some fatigue at the end of the day.  Physical Findings: Weight:  . Slight hyperpigmentation of the left breast.  Impression:  The patient is tolerating radiation.  Plan:  Continue treatment as planned. Continue radiaplex.  ------------------------------------------------  Thea Silversmith, MD    This document serves as a record of services personally performed by Thea Silversmith, MD. It was created on her behalf by  Lendon Collar, a trained medical scribe. The creation of this record is based on the scribe's personal observations and the provider's statements to them. This document has been checked and approved by the attending provider.

## 2015-10-17 ENCOUNTER — Ambulatory Visit
Admission: RE | Admit: 2015-10-17 | Discharge: 2015-10-17 | Disposition: A | Discharge status: Home / Self Care | Payer: 59 | Source: Ambulatory Visit | Attending: Radiation Oncology | Admitting: Radiation Oncology

## 2015-10-17 DIAGNOSIS — Z510 Encounter for antineoplastic radiation therapy: Secondary | ICD-10-CM | POA: Diagnosis not present

## 2015-10-17 DIAGNOSIS — Z9889 Other specified postprocedural states: Secondary | ICD-10-CM | POA: Diagnosis not present

## 2015-10-17 DIAGNOSIS — C50212 Malignant neoplasm of upper-inner quadrant of left female breast: Secondary | ICD-10-CM | POA: Diagnosis not present

## 2015-10-18 ENCOUNTER — Ambulatory Visit
Admission: RE | Admit: 2015-10-18 | Discharge: 2015-10-18 | Disposition: A | Discharge status: Home / Self Care | Payer: 59 | Source: Ambulatory Visit | Attending: Radiation Oncology | Admitting: Radiation Oncology

## 2015-10-18 DIAGNOSIS — Z9889 Other specified postprocedural states: Secondary | ICD-10-CM | POA: Diagnosis not present

## 2015-10-18 DIAGNOSIS — Z510 Encounter for antineoplastic radiation therapy: Secondary | ICD-10-CM | POA: Diagnosis not present

## 2015-10-18 DIAGNOSIS — C50212 Malignant neoplasm of upper-inner quadrant of left female breast: Secondary | ICD-10-CM | POA: Diagnosis not present

## 2015-10-19 ENCOUNTER — Ambulatory Visit
Admission: RE | Admit: 2015-10-19 | Discharge: 2015-10-19 | Disposition: A | Discharge status: Home / Self Care | Payer: 59 | Source: Ambulatory Visit | Attending: Radiation Oncology | Admitting: Radiation Oncology

## 2015-10-19 DIAGNOSIS — Z9889 Other specified postprocedural states: Secondary | ICD-10-CM | POA: Diagnosis not present

## 2015-10-19 DIAGNOSIS — Z510 Encounter for antineoplastic radiation therapy: Secondary | ICD-10-CM | POA: Diagnosis not present

## 2015-10-19 DIAGNOSIS — C50212 Malignant neoplasm of upper-inner quadrant of left female breast: Secondary | ICD-10-CM | POA: Diagnosis not present

## 2015-10-22 ENCOUNTER — Ambulatory Visit
Admission: RE | Admit: 2015-10-22 | Discharge: 2015-10-22 | Disposition: A | Discharge status: Home / Self Care | Payer: 59 | Source: Ambulatory Visit | Attending: Radiation Oncology | Admitting: Radiation Oncology

## 2015-10-22 DIAGNOSIS — Z510 Encounter for antineoplastic radiation therapy: Secondary | ICD-10-CM | POA: Diagnosis not present

## 2015-10-22 DIAGNOSIS — C50212 Malignant neoplasm of upper-inner quadrant of left female breast: Secondary | ICD-10-CM | POA: Diagnosis not present

## 2015-10-22 DIAGNOSIS — Z9889 Other specified postprocedural states: Secondary | ICD-10-CM | POA: Diagnosis not present

## 2015-10-22 MED FILL — OLMESARTAN-HCTZ 20-12.5 MG: 20-12.5 | 30 days supply | Qty: 30 | Fill #0

## 2015-10-23 ENCOUNTER — Encounter: Payer: Self-pay | Admitting: Radiation Oncology

## 2015-10-23 ENCOUNTER — Ambulatory Visit
Admission: RE | Admit: 2015-10-23 | Discharge: 2015-10-23 | Disposition: A | Discharge status: Home / Self Care | Payer: 59 | Source: Ambulatory Visit | Attending: Radiation Oncology | Admitting: Radiation Oncology

## 2015-10-23 ENCOUNTER — Ambulatory Visit: Payer: 59

## 2015-10-23 VITALS — BP 140/69 | HR 86 | Temp 98.0°F | Resp 18 | Ht 64.0 in | Wt 178.6 lb

## 2015-10-23 DIAGNOSIS — Z510 Encounter for antineoplastic radiation therapy: Secondary | ICD-10-CM | POA: Diagnosis not present

## 2015-10-23 DIAGNOSIS — C50212 Malignant neoplasm of upper-inner quadrant of left female breast: Secondary | ICD-10-CM

## 2015-10-23 DIAGNOSIS — Z9889 Other specified postprocedural states: Secondary | ICD-10-CM | POA: Diagnosis not present

## 2015-10-23 NOTE — Addendum Note (Signed)
Encounter addended by: Malena Edman, RN on: 10/23/2015  5:21 PM<BR>     Documentation filed: Notes Section

## 2015-10-23 NOTE — Progress Notes (Addendum)
Ms. Bridson has received 20 fractions to her left breast.  Skin to left breast with dark hyperpigmentation, using Radiaplex gel tid.  Appetite is good.  Denies fatigue and pain today. EOT education done. BP 140/69 mmHg  Pulse 86  Temp(Src) 98 F (36.7 C)  Resp 18  Ht 5\' 4"  (1.626 m)  Wt 178 lb 9.6 oz (81.012 kg)  BMI 30.64 kg/m2  SpO2 100%

## 2015-10-23 NOTE — Addendum Note (Signed)
Encounter addended by: Malena Edman, RN on: 10/23/2015  5:28 PM<BR>     Documentation filed: Inpatient Patient Education

## 2015-10-23 NOTE — Progress Notes (Signed)
Weekly Management Note Current Dose: 50.72  Gy  Projected Dose: 52.72 Gy   Narrative:  The patient presents for routine under treatment assessment.  CBCT/MVCT images/Port film x-rays were reviewed.  The chart was checked.  Erin Bowers has received 20 fractions to her left breast. Skin to left breast with dark hyperpigmentation, using Radiaplex gel tid. Appetite is good. Denies fatigue and pain today.   Physical Findings: Weight: 178 lb 9.6 oz (81.012 kg). Minimal skin darkness over the left breast. Filed Vitals:   10/23/15 1653  BP: 140/69  Pulse: 86  Temp: 98 F (36.7 C)  Resp: 18   Impression:  The patient is tolerating radiation.  Plan:  Continue treatment as planned. Continue radiaplex. Dicussed about survivorship program.  Advised to use Vitamin E lotion after completion of treatment. Follow up in 1 month.  ------------------------------------------------  Thea Silversmith, MD   This document serves as a record of services personally performed by Thea Silversmith, MD. It was created on her behalf by Derek Mound, a trained medical scribe. The creation of this record is based on the scribe's personal observations and the provider's statements to them. This document has been checked and approved by the attending provider.

## 2015-10-23 NOTE — Addendum Note (Signed)
Encounter addended by: Malena Edman, RN on: 10/23/2015  5:20 PM<BR>     Documentation filed: Demographics Visit

## 2015-10-24 ENCOUNTER — Ambulatory Visit
Admission: RE | Admit: 2015-10-24 | Discharge: 2015-10-24 | Disposition: A | Discharge status: Home / Self Care | Payer: 59 | Source: Ambulatory Visit | Attending: Radiation Oncology | Admitting: Radiation Oncology

## 2015-10-24 ENCOUNTER — Encounter: Payer: Self-pay | Admitting: Radiation Oncology

## 2015-10-24 DIAGNOSIS — C50212 Malignant neoplasm of upper-inner quadrant of left female breast: Secondary | ICD-10-CM | POA: Diagnosis not present

## 2015-10-24 DIAGNOSIS — Z510 Encounter for antineoplastic radiation therapy: Secondary | ICD-10-CM | POA: Diagnosis not present

## 2015-10-24 DIAGNOSIS — Z9889 Other specified postprocedural states: Secondary | ICD-10-CM | POA: Diagnosis not present

## 2015-10-25 ENCOUNTER — Telehealth: Payer: Self-pay | Admitting: *Deleted

## 2015-10-25 DIAGNOSIS — D518 Other vitamin B12 deficiency anemias: Secondary | ICD-10-CM | POA: Diagnosis not present

## 2015-10-25 DIAGNOSIS — Z79899 Other long term (current) drug therapy: Secondary | ICD-10-CM | POA: Diagnosis not present

## 2015-10-25 DIAGNOSIS — C50212 Malignant neoplasm of upper-inner quadrant of left female breast: Secondary | ICD-10-CM | POA: Diagnosis not present

## 2015-10-25 DIAGNOSIS — Z0000 Encounter for general adult medical examination without abnormal findings: Secondary | ICD-10-CM | POA: Diagnosis not present

## 2015-10-25 DIAGNOSIS — E559 Vitamin D deficiency, unspecified: Secondary | ICD-10-CM | POA: Diagnosis not present

## 2015-10-25 NOTE — Telephone Encounter (Signed)
Called pt to congratulate on completion of xrt and to discuss survivorship program. Unable to leave VM at this time. Will attempt again

## 2015-10-25 NOTE — Telephone Encounter (Signed)
Spoke with patient to follow up after XRT completion.  She states she is doing well.  Encouraged her to call with any needs or concerns.

## 2015-11-06 ENCOUNTER — Other Ambulatory Visit: Payer: Self-pay | Admitting: Adult Health

## 2015-11-06 DIAGNOSIS — C50212 Malignant neoplasm of upper-inner quadrant of left female breast: Secondary | ICD-10-CM

## 2015-11-07 ENCOUNTER — Telehealth: Payer: Self-pay | Admitting: Hematology and Oncology

## 2015-11-07 NOTE — Telephone Encounter (Signed)
Spoke with patient to confirm 8/22 appt at 4 pm

## 2015-11-08 NOTE — Progress Notes (Signed)
Name: Erin Bowers   MRN: [id number]  Date:  10/18/15   DOB: 1966-05-03  Status:outpatient    DIAGNOSIS: Breast cancer of upper-inner quadrant of left female breast [facility])   Staging form: Breast, AJCC 7th Edition     Clinical stage from 07/04/2015: Stage 0 (Tis (DCIS), N0, M0) - Unsigned       Staging comments: Staged at breast conference on 1.25.17    CONSENT VERIFIED: yes   SET UP: Patient is setup supine   IMMOBILIZATION:  The following immobilization was used:Custom Moldable Pillow, breast board.   NARRATIVE: Erin Bowers underwent complex simulation and treatment planning for her boost treatment today.  Her tumor volume was outlined on the planning CT scan.  Due to the depth of her cavity, electrons could not be used and a photon plan was developed. The plan will be prescribed to the 100%  isodose line.   I personally supervised and approved the creation of 3 unique MLCs comprising 3   treatment devices.

## 2015-11-08 NOTE — Progress Notes (Signed)
  Radiation Oncology         [phone] ________________________________  Name: Erin Bowers MRN: [id number]  Date: 10/24/2015  DOB: April 28, 1966  End of Treatment Note  Diagnosis:   Breast cancer of upper-inner quadrant of left female breast [facility])   Staging form: Breast, AJCC 7th Edition     Clinical stage from 07/04/2015: Stage 0 (Tis (DCIS), N0, M0) - Unsigned       Staging comments: Staged at breast conference on 1.25.17     Indication for treatment:  Curative     Radiation treatment dates:   09/26/2015-10/24/2015  Site/dose:   1) Left breast/ 42.72 Gy at 2.67 Gy per fraction x 16 fractions.  2) Left breast boost/ 10 Gy at 2 Gy per fraction x 5 fractions  Beams/energy:  1) 3D // 15X, 6 X 2) 3D- Breath Hold // 10X  Narrative: The patient tolerated radiation treatment relatively well.     Plan: The patient has completed radiation treatment. The patient will return to radiation oncology clinic for routine followup in one month. I advised them to call or return sooner if they have any questions or concerns related to their recovery or treatment.  ------------------------------------------------  Thea Silversmith, MD  This document serves as a record of services personally performed by Thea Silversmith, MD. It was created on her behalf by Derek Mound, a trained medical scribe. The creation of this record is based on the scribe's personal observations and the provider's statements to them. This document has been checked and approved by the attending provider.

## 2015-11-22 MED FILL — OLMESARTAN-HCTZ 20-12.5 MG: 20-12.5 | 30 days supply | Qty: 30 | Fill #0

## 2015-11-23 NOTE — Progress Notes (Signed)
Ms. Chele Cromer. Normoyle. is here for a one month for follow up visit for breast cancer of upper-inner quadrant of left female breast..  Skin status:Left breast with dark hyperpigmentation. Lotion being used: Using Shea butter bid to tid to left breast. Have you seen your medical oncologist? Date If not ,when is appointment 12-24-15 Dr. Lindi Adie When is yiur next mammogram? Has it been scheduled ? : Not scheduled yet. ER+,have started AI or Tamoxifen? If not, why? 11-27-18-17 Tamoxifen Discuss survivorship appointment:01-29-16  Chestine Spore Offer referral reading material for Survivorship, [employer] and [facility] given 10-23-15 Appetite:GFood Pain:None Arm mobility:Able to raise left arm without difficulty. Fatigue:None BP 122/74 mmHg  Pulse 72  Temp(Src) 98.4 F (36.9 C) (Oral)  Resp 16  Ht 5\' 4"  (1.626 m)  Wt 173 lb 11.2 oz (78.79 kg)  BMI 29.80 kg/m2  SpO2 99%

## 2015-11-28 ENCOUNTER — Encounter: Payer: Self-pay | Admitting: Radiation Oncology

## 2015-11-28 ENCOUNTER — Ambulatory Visit
Admission: RE | Admit: 2015-11-28 | Discharge: 2015-11-28 | Disposition: A | Discharge status: Home / Self Care | Payer: 59 | Source: Ambulatory Visit | Attending: Radiation Oncology | Admitting: Radiation Oncology

## 2015-11-28 VITALS — BP 122/74 | HR 72 | Temp 98.4°F | Resp 16 | Ht 64.0 in | Wt 173.7 lb

## 2015-11-28 DIAGNOSIS — C50212 Malignant neoplasm of upper-inner quadrant of left female breast: Secondary | ICD-10-CM

## 2015-11-28 NOTE — Progress Notes (Signed)
   Department of Radiation Oncology  Phone:  [phone] Fax:        [phone]   Name: Erin Bowers MRN: [id number]  DOB: 03-Jan-1966  Date: 11/28/2015  Follow Up Visit Note   ICD-9-CM ICD-10-CM   1. Breast cancer of upper-inner quadrant of left female breast ([place]) 174.2 C50.212     Radiation treatment dates:   09/26/2015-10/24/2015  Site/dose:   1) Left breast/ 42.72 Gy at 2.67 Gy per fraction x 16 fractions.  2) Left breast boost/ 10 Gy at 2 Gy per fraction x 5 fractions  Interval History: Erin Bowers presents today for one month follow up after completion of left breast cancer radiation treatment.   On today's visit, the patient states she is using shea butter bid to tid to the left breast. She reports that her appetite is good, her left arm ROM has improved and is currently in no pain.  Patient has a scheduled appointment with Dr. Lindi Adie on 12/24/15. The patient has started Tamoxifen as of 11/27/15. She has a survivorship appointment scheduled for 01/29/16.   Physical Exam:  Filed Vitals:   11/28/15 1606  BP: 122/74  Pulse: 72  Temp: 98.4 F (36.9 C)  TempSrc: Oral  Resp: 16  Height: 5\' 4"  (1.626 m)  Weight: 173 lb 11.2 oz (78.79 kg)  SpO2: 99%    Left breast with residual hyperpigmentation and axilla but skin is intact with no residual dryness.   IMPRESSION: Erin Bowers is a 50 y.o. female who is diagnosed with Tis N0 stage 0 left breast cancer. The patient is recovering well from the effects of radiation treatment. No evidence of disease recurrence on clinical exam. Patient is taking Tamoxifen as prescribed.   PLAN:  I encourage her to continue with yearly mammography and followup with medical oncology. I will see her back on an as-needed basis. I have encouraged her to call if she has any issues or concerns in the future. I wished her the very best. She'll continue to apply shea butter for 2-3 months and continue taking her Tamoxifen as prescribed.    -----------------------------------  Eppie Gibson, MD  This document serves as a record of services personally performed by Eppie Gibson, MD. It was created on her behalf by Derek Mound, a trained medical scribe. The creation of this record is based on the scribe's personal observations and the provider's statements to them. This document has been checked and approved by the attending provider.

## 2015-12-24 ENCOUNTER — Telehealth: Payer: Self-pay | Admitting: Hematology and Oncology

## 2015-12-24 ENCOUNTER — Ambulatory Visit (HOSPITAL_BASED_OUTPATIENT_CLINIC_OR_DEPARTMENT_OTHER): Payer: 59 | Admitting: Hematology and Oncology

## 2015-12-24 ENCOUNTER — Encounter: Payer: Self-pay | Admitting: Hematology and Oncology

## 2015-12-24 VITALS — BP 120/79 | HR 79 | Temp 98.1°F | Resp 18 | Wt 174.2 lb

## 2015-12-24 DIAGNOSIS — D0512 Intraductal carcinoma in situ of left breast: Secondary | ICD-10-CM

## 2015-12-24 DIAGNOSIS — C50212 Malignant neoplasm of upper-inner quadrant of left female breast: Secondary | ICD-10-CM

## 2015-12-24 MED FILL — OLMESARTAN-HCTZ 20-12.5 MG: 20-12.5 | 30 days supply | Qty: 30 | Fill #1

## 2015-12-24 NOTE — Telephone Encounter (Signed)
appt made and avs printed °

## 2015-12-24 NOTE — Progress Notes (Signed)
Patient Care Team: Nanci Pina, FNP as PCP - General (Nurse Practitioner) Autumn Messing III, MD as Consulting Physician (General Surgery) Nicholas Lose, MD as Consulting Physician (Hematology and Oncology) Thea Silversmith, MD as Consulting Physician (Radiation Oncology) Sylvan Cheese, NP as Nurse Practitioner (Hematology and Oncology)  DIAGNOSIS: Breast cancer of upper-inner quadrant of left female breast [facility])   Staging form: Breast, AJCC 7th Edition     Clinical stage from 07/04/2015: Stage 0 (Tis (DCIS), N0, M0) - Unsigned       Staging comments: Staged at breast conference on 1.25.17   SUMMARY OF ONCOLOGIC HISTORY:   Breast cancer of upper-inner quadrant of left female breast ([place])   06/25/2015 Mammogram Screening detected left breast calcifications posterior depth 4.4 x 3.3 x 2.3 cm   06/27/2015 Initial Diagnosis Left breast biopsy upper quadrant: DCIS with comedo necrosis and calcification, grade 2-3, ER 100%, PR 15%, Tis N0 stage 0 clinical stage   07/26/2015 Surgery Left lumpectomy: Intermediate to high-grade DCIS with comedonecrosis, DCIS close superior margin, excision left superior margin DCIS less than 0.1 cm, additional superior margin still showed DCIS focally positive, ER 100%, PR 15%   08/17/2015 Surgery Left superior margin excision: Less than 0.2 cm residual high-grade DCIS; left additional superior/medial margin: Benign   09/26/2015 - 10/24/2015 Radiation Therapy Adjuvant radiation therapy   11/23/2015 -  Anti-estrogen oral therapy Tamoxifen 20 mg daily 5 years    CHIEF COMPLIANT: Follow-up on tamoxifen therapy  INTERVAL HISTORY: Erin Bowers is a 50 year old with above-mentioned history of DCIS involving the left breast were underwent lumpectomy followed by radiation and is currently on tamoxifen therapy for the past 1 month. She appears to be tolerating tamoxifen extremely well. She does have moderate degree of hot flashes which seem to be bothering her but not  enough to change her quality of life. Denies any muscle or skeletal aches and pains.  REVIEW OF SYSTEMS:   Constitutional: Denies fevers, chills or abnormal weight loss Eyes: Denies blurriness of vision Ears, nose, mouth, throat, and face: Denies mucositis or sore throat Respiratory: Denies cough, dyspnea or wheezes Cardiovascular: Denies palpitation, chest discomfort Gastrointestinal:  Denies nausea, heartburn or change in bowel habits Skin: Denies abnormal skin rashes Lymphatics: Denies new lymphadenopathy or easy bruising Neurological:Denies numbness, tingling or new weaknesses Behavioral/Psych: Mood is stable, no new changes  Extremities: No lower extremity edema Breast:  denies any pain or lumps or nodules in either breasts All other systems were reviewed with the patient and are negative.  I have reviewed the past medical history, past surgical history, social history and family history with the patient and they are unchanged from previous note.  ALLERGIES:  is allergic to adhesive.  MEDICATIONS:  Current Outpatient Prescriptions  Medication Sig Dispense Refill  . Ascorbic Acid (VITAMIN C) 1000 MG tablet Take 1,000 mg by mouth daily.    . Cholecalciferol (D3-1000) 1000 units capsule Take 1,000 Units by mouth daily.    . Multiple Vitamin (MULTIVITAMIN) tablet Take 1 tablet by mouth daily.    Marland Kitchen olmesartan-hydrochlorothiazide (BENICAR HCT) 20-12.5 MG tablet Reported on 10/23/2015  3  . pyridOXINE (VITAMIN B-6) 100 MG tablet Take 100 mg by mouth daily.    . tamoxifen (NOLVADEX) 20 MG tablet Take 1 tablet (20 mg total) by mouth daily. 90 tablet 3  . UNABLE TO FIND Take 500 mg by mouth daily. Mega Red    . vitamin B-12 (CYANOCOBALAMIN) 1000 MCG tablet Take 1,000 mcg by mouth daily.  No current facility-administered medications for this visit.    PHYSICAL EXAMINATION: ECOG PERFORMANCE STATUS: 1 - Symptomatic but completely ambulatory  Filed Vitals:   12/24/15 1507  BP:  120/79  Pulse: 79  Temp: 98.1 F (36.7 C)  Resp: 18   Filed Weights   12/24/15 1507  Weight: 174 lb 3.2 oz (79.017 kg)    GENERAL:alert, no distress and comfortable SKIN: skin color, texture, turgor are normal, no rashes or significant lesions EYES: normal, Conjunctiva are pink and non-injected, sclera clear OROPHARYNX:no exudate, no erythema and lips, buccal mucosa, and tongue normal  NECK: supple, thyroid normal size, non-tender, without nodularity LYMPH:  no palpable lymphadenopathy in the cervical, axillary or inguinal LUNGS: clear to auscultation and percussion with normal breathing effort HEART: regular rate & rhythm and no murmurs and no lower extremity edema ABDOMEN:abdomen soft, non-tender and normal bowel sounds MUSCULOSKELETAL:no cyanosis of digits and no clubbing  NEURO: alert & oriented x 3 with fluent speech, no focal motor/sensory deficits EXTREMITIES: No lower extremity edema  LABORATORY DATA:  I have reviewed the data as listed   Chemistry      Component Value Date/Time   NA 143 07/04/2015 0849   K 3.7 07/04/2015 0849   CO2 30* 07/04/2015 0849   BUN 15.9 07/04/2015 0849   CREATININE 0.8 07/04/2015 0849      Component Value Date/Time   CALCIUM 10.1 07/04/2015 0849   ALKPHOS 73 07/04/2015 0849   AST 19 07/04/2015 0849   ALT 20 07/04/2015 0849   BILITOT 0.33 07/04/2015 0849       Lab Results  Component Value Date   WBC 5.6 07/04/2015   HGB 12.8 07/04/2015   HCT 38.3 07/04/2015   MCV 81.2 07/04/2015   PLT 289 07/04/2015   NEUTROABS 2.5 07/04/2015     ASSESSMENT & PLAN:  Breast cancer of upper-inner quadrant of left female breast ([place]) Left lumpectomy 08/03/2015 : Intermediate to high-grade DCIS with comedonecrosis, DCIS close superior margin, excision left superior margin DCIS less than 0.1 cm, additional superior margin still showed DCIS focally positive, ER 100%, PR 15% Reexcision of margins 08/17/2015: 0.2 cm residual DCIS but final margins  negative Pathologic stage: Tis N0 stage 0 Adjuvant radiation therapy from 09/26/2015 to 10/24/2015  Current treatment: Tamoxifen 20 mg daily 5 years started 11/23/2015  Tamoxifen toxicities: 1. Hot flashes moderate degree Denies any musculoskeletal aches and pains.  Return to clinic in 6 months for follow-up.    No orders of the defined types were placed in this encounter.   The patient has a good understanding of the overall plan. she agrees with it. she will call with any problems that may develop before the next visit here.   Rulon Eisenmenger, MD 12/24/2015

## 2015-12-24 NOTE — Assessment & Plan Note (Signed)
Left lumpectomy 08/03/2015 : Intermediate to high-grade DCIS with comedonecrosis, DCIS close superior margin, excision left superior margin DCIS less than 0.1 cm, additional superior margin still showed DCIS focally positive, ER 100%, PR 15% Reexcision of margins 08/17/2015: 0.2 cm residual DCIS but final margins negative Pathologic stage: Tis N0 stage 0 Adjuvant radiation therapy from 09/26/2015 to 10/24/2015  Current treatment: Tamoxifen 20 mg daily 5 years started 11/23/2015  Tamoxifen toxicities: 1. Hot flashes moderate degree Denies any musculoskeletal aches and pains.  Return to clinic in 6 months for follow-up.

## 2016-01-14 DIAGNOSIS — D0512 Intraductal carcinoma in situ of left breast: Secondary | ICD-10-CM | POA: Diagnosis not present

## 2016-01-26 ENCOUNTER — Telehealth: Payer: Self-pay | Admitting: Hematology and Oncology

## 2016-01-26 NOTE — Telephone Encounter (Signed)
Returned pt's call re rescheduling 9/1 appt to after 3pm but got no answer no vm. Will try again Monday.

## 2016-01-29 ENCOUNTER — Encounter: Payer: 59 | Admitting: Nurse Practitioner

## 2016-01-29 MED FILL — OLMESARTAN-HCTZ 20-12.5 MG: 20-12.5 | 30 days supply | Qty: 30 | Fill #2

## 2016-02-08 ENCOUNTER — Encounter: Payer: 59 | Admitting: Adult Health

## 2016-02-19 MED FILL — TAMOXIFEN 20 MG TABLET: 20 | 90 days supply | Qty: 90 | Fill #1

## 2016-02-28 MED FILL — OLMESARTAN-HCTZ 20-12.5 MG: 20-12.5 | 30 days supply | Qty: 30 | Fill #3

## 2016-03-12 ENCOUNTER — Other Ambulatory Visit: Payer: Self-pay | Admitting: Family

## 2016-03-12 DIAGNOSIS — Z853 Personal history of malignant neoplasm of breast: Secondary | ICD-10-CM

## 2016-03-28 ENCOUNTER — Ambulatory Visit (HOSPITAL_BASED_OUTPATIENT_CLINIC_OR_DEPARTMENT_OTHER): Payer: 59 | Admitting: Adult Health

## 2016-03-28 ENCOUNTER — Encounter: Payer: Self-pay | Admitting: Adult Health

## 2016-03-28 VITALS — BP 128/72 | HR 79 | Temp 98.6°F | Resp 18 | Wt 181.5 lb

## 2016-03-28 DIAGNOSIS — D0512 Intraductal carcinoma in situ of left breast: Secondary | ICD-10-CM

## 2016-03-28 DIAGNOSIS — Z170 Estrogen receptor positive status [ER+]: Secondary | ICD-10-CM

## 2016-03-28 DIAGNOSIS — C50212 Malignant neoplasm of upper-inner quadrant of left female breast: Secondary | ICD-10-CM

## 2016-03-28 DIAGNOSIS — N951 Menopausal and female climacteric states: Secondary | ICD-10-CM

## 2016-03-28 NOTE — Progress Notes (Addendum)
CLINIC:  Survivorship   REASON FOR VISIT:  Routine follow-up post-treatment for a recent history of breast cancer.  BRIEF ONCOLOGIC HISTORY:  Oncology History   Erin Bowers     Breast cancer of upper-inner quadrant of left female breast ([place])   06/25/2015 Mammogram    Screening detected left breast calcifications posterior depth 4.4 x 3.3 x 2.3 cm      06/27/2015 Initial Diagnosis    Left breast biopsy upper quadrant: DCIS with comedo necrosis and calcification, grade 2-3, ER 100%, PR 15%, Tis N0 stage 0 clinical stage      08/03/2015 Surgery    Left lumpectomy Marlou Starks): Intermediate to high-grade DCIS with comedonecrosis, DCIS close superior margin, excision left superior margin DCIS less than 0.1 cm, additional superior margin still showed DCIS focally positive, ER 100%, PR 15%      08/17/2015 Surgery    Left superior margin excision: Less than 0.2 cm residual high-grade DCIS; left additional superior/medial margin: Benign      09/26/2015 - 10/24/2015 Radiation Therapy    Adjuvant radiation therapy [name]). Left breast/ 42.72 Gy at 2.67 Gy per fraction x 16 fractions. Left breast boost/ 10 Gy at 2 Gy per fraction x 5 fractions      11/23/2015 -  Anti-estrogen oral therapy    Tamoxifen 20 mg daily 5 years       INTERVAL HISTORY:  Erin Bowers presents to the [facility] today for our initial meeting to review her survivorship care plan detailing her treatment course for breast cancer, as well as monitoring long-term side effects of that treatment, education regarding health maintenance, screening, and overall wellness and health promotion.     Overall, Erin Bowers reports feeling quite well since completing her radiation therapy approximately 5 months ago.  She Started tamoxifen about 4 months ago. She has occasional hot flashes, which has been a year since she was 50 years old; she has about 3-4 per day, which leaves her sweating. She feels like the hot flashes are manageable, in  does not want any prescription medication at this time.  She reports occasional left breast sharp shooting pain. She is wondering if this is normal. She is exercising regularly by walking and doing light weightlifting. She continues to work full-time and is overall doing very well, largely with no other complaints today.  REVIEW OF SYSTEMS:  Review of Systems  Constitutional: Negative.   HENT: Negative.   Eyes: Negative.   Respiratory: Negative.   Cardiovascular: Negative.   Gastrointestinal: Negative.   Genitourinary: Negative.   Musculoskeletal: Negative.   Skin: Negative.   Neurological: Negative.   Endo/Heme/Allergies:       Occasional hot flashes  Psychiatric/Behavioral: Negative.   GU: Denies vaginal bleeding, discharge, or dryness.  Breast: Denies any new nodularity, masses, nipple changes, or nipple discharge.        Sharp shooting pain in the left breast intermittently   A 14-point review of systems was completed and was negative, except as noted above.   ONCOLOGY TREATMENT TEAM:  1. Surgeon:  Dr. Marlou Starks at [facility] 2. Medical Oncologist: Dr. Lindi Adie  3. Radiation Oncologist: Dr. [name]    PAST MEDICAL/SURGICAL HISTORY:  Past Medical History:  Diagnosis Date  . Anxiety   . Breast cancer ([place])   . Hypertension    Past Surgical History:  Procedure Laterality Date  . ABDOMINAL HYSTERECTOMY    . BREAST LUMPECTOMY WITH NEEDLE LOCALIZATION Left 08/03/2015   Procedure: BREAST LUMPECTOMY WITH BRACKETED NEEDLE  LOCALIZATION;  Surgeon: Autumn Messing III, MD;  Location: [place];  Service: General;  Laterality: Left;  . RE-EXCISION OF BREAST CANCER,SUPERIOR MARGINS Left 08/17/2015   Procedure: RE-EXCISION OF BREAST CANCER,SUPERIOR MARGINS;  Surgeon: Autumn Messing III, MD;  Location: [place];  Service: General;  Laterality: Left;     ALLERGIES:  Allergies  Allergen Reactions  . Adhesive [Tape] Other (See Comments)     Steri-strips cause blisters      CURRENT MEDICATIONS:  Outpatient Encounter Prescriptions as of 03/28/2016  Medication Sig Note  . Ascorbic Acid (VITAMIN C) 1000 MG tablet Take 1,000 mg by mouth daily.   . Cholecalciferol (D3-1000) 1000 units capsule Take 1,000 Units by mouth daily.   . Multiple Vitamin (MULTIVITAMIN) tablet Take 1 tablet by mouth daily.   Marland Kitchen olmesartan-hydrochlorothiazide (BENICAR HCT) 20-12.5 MG tablet Reported on 10/23/2015 07/04/2015: Received from: External Pharmacy  . pyridOXINE (VITAMIN B-6) 100 MG tablet Take 100 mg by mouth daily.   . tamoxifen (NOLVADEX) 20 MG tablet Take 1 tablet (20 mg total) by mouth daily.   Marland Kitchen UNABLE TO FIND Take 500 mg by mouth daily. Mega Red   . vitamin B-12 (CYANOCOBALAMIN) 1000 MCG tablet Take 1,000 mcg by mouth daily.    No facility-administered encounter medications on file as of 03/28/2016.      ONCOLOGIC FAMILY HISTORY:  No family history on file.   GENETIC COUNSELING/TESTING: None.  SOCIAL HISTORY:  Erin Bowers is divorced and lives in [place], [place]. She works full-time as the Administrator, arts at [employer]. She does not have any children. She does have a 64-year-old [place] terrier named Scientist, forensic. For findings, she enjoys watching black and white movies, going to the beach, and going for walks.  She denies any current tobacco, alcohol, or illicit drug use.     PHYSICAL EXAMINATION:  Vital Signs:   Vitals:   03/28/16 1346  BP: 128/72  Pulse: 79  Resp: 18  Temp: 98.6 F (37 C)   Filed Weights   03/28/16 1346  Weight: 181 lb 8 oz (82.3 kg)   General: Well-nourished, well-appearing female in no acute distress.  She is unaccompanied today.   HEENT: Head is normocephalic.  Pupils equal and reactive to light. Conjunctivae clear without exudate.  Sclerae anicteric. Oral mucosa is pink, moist.  Oropharynx is pink without lesions or erythema.  Lymph: No cervical, supraclavicular, or infraclavicular lymphadenopathy  noted on palpation.  Cardiovascular: Regular rate and rhythm.Marland Kitchen Respiratory: Clear to auscultation bilaterally. Chest expansion symmetric; breathing non-labored.  GI: Abdomen soft and round; non-tender, non-distended. Bowel sounds normoactive.  GU: Deferred.  Neuro: No focal deficits. Steady gait.  Psych: Mood and affect normal and appropriate for situation.  Extremities: No edema. Skin: Warm and dry.  LABORATORY DATA:  None for this visit.  DIAGNOSTIC IMAGING:  None for this visit.      ASSESSMENT AND PLAN:  Erin Bowers is a pleasant 50 y.o. female with Stage 0 left breast DCIS, ER+/PR+, diagnosed in 06/2015; treated with lumpectomy/re-excision, adjuvant radiation therapy, and anti-estrogen therapy with Tamoxifen beginning in 11/2015.  She presents to the Survivorship Clinic for our initial meeting and routine follow-up post-completion of treatment for breast cancer.    1. Stage 0 left breast cancer:  Erin Bowers is continuing to recover from definitive treatment for breast cancer. She is already scheduled to have her first post-treatment mammogram on 04/29/16. She will follow-up with her medical oncologist, Dr. Lindi Adie, in 06/2016. She  will continue on the tamoxifen; thus far she is tolerating the medication very well with minimal side effects except for hot flashes (see #2 below). We again reviewed common side effects of tamoxifen. She has a history of hysterectomy, so the low risk of associated endometrial cancer with tamoxifen and does not apply to her.  Today, a comprehensive survivorship care plan and treatment summary was reviewed with the patient today detailing her breast cancer diagnosis, treatment course, potential late/long-term effects of treatment, appropriate follow-up care with recommendations for the future, and patient education resources.  A copy of this summary, along with a letter will be sent to the patient's primary care provider via mail/fax/In Basket message after today's  visit.    2. Hot flashes secondary to tamoxifen: Erin Bowers is experiencing moderate hot flashes while on the tamoxifen. At this time, she does not wish to have any prescription medication to help manage this. I encouraged her to try vitamin E supplements, as some women find this helpful in managing hot flashes associated with both menopause and tamoxifen. She will give this a try. I encouraged her to let us know if the hot flashes worsened, then we could certainly reconsider starting her on gabapentin or Effexor in the future. She agreed with this plan.  3. Bone health:  Given Erin Bowers's history of breast cancer, she is at risk for bone demineralization.  We do not have records of any DEXA scan imaging. We did discuss that tamoxifen often provides the positive side effect of increased bone density when compared to aromatase inhibitors. Therefore I will defer any future DEXA scan orders to her PCP or medical oncologist, as clinically indicated. In the meantime, she was encouraged to increase her consumption of foods rich in calcium, as well as increase her weight-bearing activities.  She was given education on specific activities to promote bone health.  4. Cancer screening:  Due to Erin Bowers's history and her age, she should receive screening for skin cancers, colon cancer, and gynecologic cancers.  The information and recommendations are listed on the patient's comprehensive care plan/treatment summary and were reviewed in detail with the patient.    5. Health maintenance and wellness promotion: Erin Bowers was encouraged to consume 5-7 servings of fruits and vegetables per day. We reviewed the "Nutrition Rainbow" handout, which provides patients with education regarding the anticancer properties of foods and certain color groups. She was also encouraged to engage in moderate to vigorous exercise for 30 minutes per day most days of the week.   I commended her efforts on her walking regimen and light  weightlifting & encouraged her to keep up the good work. We discussed the [employer] fitness program, which is designed for cancer survivors to help them become more physically fit after cancer treatments.  She was instructed to limit her alcohol consumption and continue to abstain from tobacco use.   6. Support services/counseling: It is not uncommon for this period of the patient's cancer care trajectory to be one of many emotions and stressors.  We discussed an opportunity for her to participate in the next session of [facility] ("Finding Your New Normal") support group series designed for patients after they have completed treatment.   Erin Bowers was encouraged to take advantage of our many other support services programs, support groups, and/or counseling in coping with her new life as a cancer survivor after completing anti-cancer treatment.  She was offered support today through active listening and expressive supportive counseling.  She  was given information regarding our available services and encouraged to contact me with any questions or for help enrolling in any of our support group/programs.    Dispo:   -Post-treatment mammogram already scheduled for 04/29/16.  -Return to cancer centeto see Dr. Lindi Adie in 06/2016. -She is welcome to return back to the Survivorship Clinic at any time; no additional follow-up needed at this time.  -Consider referral back to survivorship as a long-term survivor for continued surveillance   A total of 35 minutes of face-to-face time was spent with this patient with greater than 50% of that time in counseling and care-coordination.   Mike Craze, NP Survivorship Program [facility] [phone]   Note: PRIMARY CARE PROVIDER Nanci Pina, [place] [phone]

## 2016-03-31 MED FILL — OLMESARTAN-HCTZ 20-12.5 MG: 20-12.5 | 30 days supply | Qty: 30 | Fill #4

## 2016-04-24 DIAGNOSIS — D513 Other dietary vitamin B12 deficiency anemia: Secondary | ICD-10-CM | POA: Diagnosis not present

## 2016-04-24 DIAGNOSIS — Z79899 Other long term (current) drug therapy: Secondary | ICD-10-CM | POA: Diagnosis not present

## 2016-04-24 DIAGNOSIS — E782 Mixed hyperlipidemia: Secondary | ICD-10-CM | POA: Diagnosis not present

## 2016-04-24 DIAGNOSIS — Z853 Personal history of malignant neoplasm of breast: Secondary | ICD-10-CM | POA: Diagnosis not present

## 2016-04-24 DIAGNOSIS — I10 Essential (primary) hypertension: Secondary | ICD-10-CM | POA: Diagnosis not present

## 2016-05-05 MED FILL — OLMESARTAN-HCTZ 20-12.5 MG: 20-12.5 | 30 days supply | Qty: 30 | Fill #5

## 2016-05-07 ENCOUNTER — Ambulatory Visit
Admission: RE | Admit: 2016-05-07 | Discharge: 2016-05-07 | Disposition: A | Discharge status: Home / Self Care | Payer: 59 | Source: Ambulatory Visit | Attending: Family | Admitting: Family

## 2016-05-07 DIAGNOSIS — R922 Inconclusive mammogram: Secondary | ICD-10-CM | POA: Diagnosis not present

## 2016-05-07 DIAGNOSIS — Z853 Personal history of malignant neoplasm of breast: Secondary | ICD-10-CM

## 2016-05-20 MED FILL — TAMOXIFEN 20 MG TABLET: 20 | 90 days supply | Qty: 90 | Fill #2

## 2016-06-11 MED FILL — OLMESARTAN-HCTZ 20-12.5 MG: 20-12.5 | 30 days supply | Qty: 30 | Fill #0

## 2016-06-16 NOTE — Assessment & Plan Note (Signed)
Left lumpectomy 08/03/2015 : Intermediate to high-grade DCIS with comedonecrosis, DCIS close superior margin, excision left superior margin DCIS less than 0.1 cm, additional superior margin still showed DCIS focally positive, ER 100%, PR 15% Reexcision of margins 08/17/2015: 0.2 cm residual DCIS but final margins negative Pathologic stage: Tis N0 stage 0 Adjuvant radiation therapy from 09/26/2015 to 10/24/2015  Current treatment: Tamoxifen 20 mg daily 5 years started 11/23/2015  Tamoxifen toxicities: 1. Hot flashes moderate degree Denies any musculoskeletal aches and pains.  Return to clinic in 1 year for follow-up.

## 2016-06-17 ENCOUNTER — Ambulatory Visit (HOSPITAL_BASED_OUTPATIENT_CLINIC_OR_DEPARTMENT_OTHER): Payer: 59 | Admitting: Hematology and Oncology

## 2016-06-17 ENCOUNTER — Encounter: Payer: Self-pay | Admitting: Hematology and Oncology

## 2016-06-17 DIAGNOSIS — N951 Menopausal and female climacteric states: Secondary | ICD-10-CM

## 2016-06-17 DIAGNOSIS — Z170 Estrogen receptor positive status [ER+]: Secondary | ICD-10-CM

## 2016-06-17 DIAGNOSIS — D0512 Intraductal carcinoma in situ of left breast: Secondary | ICD-10-CM | POA: Diagnosis not present

## 2016-06-17 DIAGNOSIS — C50212 Malignant neoplasm of upper-inner quadrant of left female breast: Secondary | ICD-10-CM

## 2016-06-17 MED ORDER — TAMOXIFEN CITRATE 20 MG PO TABS: 20.0000 mg | ORAL_TABLET | Freq: Every day | ORAL | 3 refills | Status: DC

## 2016-06-17 NOTE — Progress Notes (Signed)
Patient Care Team: Nanci Pina, FNP as PCP - General (Nurse Practitioner) Autumn Messing III, MD as Consulting Physician (General Surgery) Nicholas Lose, MD as Consulting Physician (Hematology and Oncology) Thea Silversmith, MD as Consulting Physician (Radiation Oncology) Sylvan Cheese, NP as Nurse Practitioner (Hematology and Oncology)  DIAGNOSIS:  Encounter Diagnosis  Name Primary?  . Malignant neoplasm of upper-inner quadrant of left breast in female, estrogen receptor positive ([place])     SUMMARY OF ONCOLOGIC HISTORY: Oncology History   L     Breast cancer of upper-inner quadrant of left female breast ([place])   06/25/2015 Mammogram    Screening detected left breast calcifications posterior depth 4.4 x 3.3 x 2.3 cm      06/27/2015 Initial Diagnosis    Left breast biopsy upper quadrant: DCIS with comedo necrosis and calcification, grade 2-3, ER 100%, PR 15%, Tis N0 stage 0 clinical stage      08/03/2015 Surgery    Left lumpectomy Marlou Starks): Intermediate to high-grade DCIS with comedonecrosis, DCIS close superior margin, excision left superior margin DCIS less than 0.1 cm, additional superior margin still showed DCIS focally positive, ER 100%, PR 15%      08/17/2015 Surgery    Left superior margin excision: Less than 0.2 cm residual high-grade DCIS; left additional superior/medial margin: Benign      09/26/2015 - 10/24/2015 Radiation Therapy    Adjuvant radiation therapy [name]). Left breast/ 42.72 Gy at 2.67 Gy per fraction x 16 fractions. Left breast boost/ 10 Gy at 2 Gy per fraction x 5 fractions      11/23/2015 -  Anti-estrogen oral therapy    Tamoxifen 20 mg daily 5 years       CHIEF COMPLIANT: Follow-up on tamoxifen therapy  INTERVAL HISTORY: Erin Bowers is a 51 year old with above-mentioned history of left breast DCIS treated with lumpectomy followed by adjuvant radiation and is currently on tamoxifen. She is tolerating it extremely well. She complains of  mild hot flashes. They have improved from before. She denies any major discomfort in the breast. Occasionally she gets a little pain in the breasts. Denies any lumps or nodules.  REVIEW OF SYSTEMS:   Constitutional: Denies fevers, chills or abnormal weight loss Eyes: Denies blurriness of vision Ears, nose, mouth, throat, and face: Denies mucositis or sore throat Respiratory: Denies cough, dyspnea or wheezes Cardiovascular: Denies palpitation, chest discomfort Gastrointestinal:  Denies nausea, heartburn or change in bowel habits Skin: Denies abnormal skin rashes Lymphatics: Denies new lymphadenopathy or easy bruising Neurological:Denies numbness, tingling or new weaknesses Behavioral/Psych: Mood is stable, no new changes  Extremities: No lower extremity edema Breast:  denies any pain or lumps or nodules in either breasts All other systems were reviewed with the patient and are negative.  I have reviewed the past medical history, past surgical history, social history and family history with the patient and they are unchanged from previous note.  ALLERGIES:  is allergic to adhesive [tape].  MEDICATIONS:  Current Outpatient Prescriptions  Medication Sig Dispense Refill  . Ascorbic Acid (VITAMIN C) 1000 MG tablet Take 1,000 mg by mouth daily.    . Cholecalciferol (D3-1000) 1000 units capsule Take 1,000 Units by mouth daily.    . Multiple Vitamin (MULTIVITAMIN) tablet Take 1 tablet by mouth daily.    Marland Kitchen olmesartan-hydrochlorothiazide (BENICAR HCT) 20-12.5 MG tablet Reported on 10/23/2015  3  . pyridOXINE (VITAMIN B-6) 100 MG tablet Take 100 mg by mouth daily.    . tamoxifen (NOLVADEX) 20 MG tablet Take  1 tablet (20 mg total) by mouth daily. 90 tablet 3  . UNABLE TO FIND Take 500 mg by mouth daily. Mega Red    . vitamin B-12 (CYANOCOBALAMIN) 1000 MCG tablet Take 1,000 mcg by mouth daily.     No current facility-administered medications for this visit.     PHYSICAL EXAMINATION: ECOG  PERFORMANCE STATUS: 1 - Symptomatic but completely ambulatory  Vitals:   06/17/16 1544  BP: 120/76  Pulse: 79  Resp: 18  Temp: 98.5 F (36.9 C)   Filed Weights   06/17/16 1544  Weight: 184 lb 12.8 oz (83.8 kg)    GENERAL:alert, no distress and comfortable SKIN: skin color, texture, turgor are normal, no rashes or significant lesions EYES: normal, Conjunctiva are pink and non-injected, sclera clear OROPHARYNX:no exudate, no erythema and lips, buccal mucosa, and tongue normal  NECK: supple, thyroid normal size, non-tender, without nodularity LYMPH:  no palpable lymphadenopathy in the cervical, axillary or inguinal LUNGS: clear to auscultation and percussion with normal breathing effort HEART: regular rate & rhythm and no murmurs and no lower extremity edema ABDOMEN:abdomen soft, non-tender and normal bowel sounds MUSCULOSKELETAL:no cyanosis of digits and no clubbing  NEURO: alert & oriented x 3 with fluent speech, no focal motor/sensory deficits EXTREMITIES: No lower extremity edema BREAST: No palpable masses or nodules in either right or left breasts. No palpable axillary supraclavicular or infraclavicular adenopathy no breast tenderness or nipple discharge. (exam performed in the presence of a chaperone)  LABORATORY DATA:  I have reviewed the data as listed   Chemistry      Component Value Date/Time   NA 143 07/04/2015 0849   K 3.7 07/04/2015 0849   CO2 30 (H) 07/04/2015 0849   BUN 15.9 07/04/2015 0849   CREATININE 0.8 07/04/2015 0849      Component Value Date/Time   CALCIUM 10.1 07/04/2015 0849   ALKPHOS 73 07/04/2015 0849   AST 19 07/04/2015 0849   ALT 20 07/04/2015 0849   BILITOT 0.33 07/04/2015 0849       Lab Results  Component Value Date   WBC 5.6 07/04/2015   HGB 12.8 07/04/2015   HCT 38.3 07/04/2015   MCV 81.2 07/04/2015   PLT 289 07/04/2015   NEUTROABS 2.5 07/04/2015    ASSESSMENT & PLAN:  Breast cancer of upper-inner quadrant of left female breast  ([place]) Left lumpectomy 08/03/2015 : Intermediate to high-grade DCIS with comedonecrosis, DCIS close superior margin, excision left superior margin DCIS less than 0.1 cm, additional superior margin still showed DCIS focally positive, ER 100%, PR 15% Reexcision of margins 08/17/2015: 0.2 cm residual DCIS but final margins negative Pathologic stage: Tis N0 stage 0 Adjuvant radiation therapy from 09/26/2015 to 10/24/2015  Current treatment: Tamoxifen 20 mg daily 5 years started 11/23/2015  Tamoxifen toxicities: 1. Hot flashes moderate degree Denies any musculoskeletal aches and pains.  Breast Cancer Surveillance: 1. Breast exam on 06/17/2016: Normal 2. Mammogram 05/07/2016 No abnormalities. Postsurgical changes. Breast Density Category C. I recommended that she get 3-D mammograms for surveillance. Discussed the differences between different breast density categories.   Return to clinic in 1 year for follow-up.   I spent 15 minutes talking to the patient of which more than half was spent in counseling and coordination of care.  No orders of the defined types were placed in this encounter.  The patient has a good understanding of the overall plan. she agrees with it. she will call with any problems that may develop before the next visit  here.   Rulon Eisenmenger, MD 06/17/16

## 2016-06-25 ENCOUNTER — Ambulatory Visit: Payer: 59 | Admitting: Hematology and Oncology

## 2016-07-09 MED FILL — OLMESARTAN-HCTZ 20-12.5 MG: 20-12.5 | 30 days supply | Qty: 30 | Fill #1

## 2016-08-07 MED FILL — OLMESARTAN-HCTZ 20-12.5 MG: 20-12.5 | 30 days supply | Qty: 30 | Fill #2

## 2016-08-18 MED FILL — TAMOXIFEN 20 MG TABLET: 20 | 90 days supply | Qty: 90 | Fill #3

## 2016-09-08 MED FILL — OLMESARTAN-HCTZ 20-12.5 MG: 20-12.5 | 30 days supply | Qty: 30 | Fill #3

## 2016-10-06 MED FILL — OLMESARTAN-HCTZ 20-12.5 MG: 20-12.5 | 30 days supply | Qty: 30 | Fill #4

## 2016-10-23 DIAGNOSIS — I10 Essential (primary) hypertension: Secondary | ICD-10-CM | POA: Diagnosis not present

## 2016-10-23 DIAGNOSIS — Z0000 Encounter for general adult medical examination without abnormal findings: Secondary | ICD-10-CM | POA: Diagnosis not present

## 2016-11-07 MED FILL — OLMESARTAN-HCTZ 20-12.5 MG: 20-12.5 | 30 days supply | Qty: 30 | Fill #5

## 2016-11-17 MED FILL — TAMOXIFEN 20 MG TABLET: 20 | 90 days supply | Qty: 90 | Fill #0

## 2016-12-04 MED FILL — OLMESARTAN-HCTZ 20-12.5 MG: 20-12.5 | 90 days supply | Qty: 90 | Fill #0

## 2017-02-13 MED FILL — TAMOXIFEN CITRATE 20 MG TAB: 20 | 90 days supply | Qty: 90 | Fill #1

## 2017-03-03 MED FILL — IBUPROFEN 400 MG TABS: 400 | 2 days supply | Qty: 16 | Fill #0

## 2017-03-16 MED FILL — OLMESARTAN-HCTZ 20-12.5 MG: 20-12.5 | 90 days supply | Qty: 90 | Fill #1

## 2017-04-02 MED FILL — AMOXICILLIN 875 MG TABLET: 875 | 10 days supply | Qty: 20 | Fill #0

## 2017-04-13 MED FILL — AMOXICILLIN 875 MG TABLET: 875 | 10 days supply | Qty: 20 | Fill #1

## 2017-04-22 ENCOUNTER — Other Ambulatory Visit: Payer: Self-pay | Admitting: Family

## 2017-04-22 DIAGNOSIS — Z853 Personal history of malignant neoplasm of breast: Secondary | ICD-10-CM

## 2017-05-08 ENCOUNTER — Ambulatory Visit
Admission: RE | Admit: 2017-05-08 | Discharge: 2017-05-08 | Disposition: A | Discharge status: Home / Self Care | Payer: 59 | Source: Ambulatory Visit | Attending: Family | Admitting: Family

## 2017-05-08 DIAGNOSIS — Z853 Personal history of malignant neoplasm of breast: Secondary | ICD-10-CM

## 2017-05-08 DIAGNOSIS — R922 Inconclusive mammogram: Secondary | ICD-10-CM | POA: Diagnosis not present

## 2017-05-08 HISTORY — DX: Personal history of irradiation: Z92.3

## 2017-05-13 MED FILL — TAMOXIFEN CITRATE 20 MG TAB: 20 | 90 days supply | Qty: 90 | Fill #2

## 2017-06-16 MED FILL — OLMESARTAN-HCTZ 20-12.5 MG: 20-12.5 | 90 days supply | Qty: 90 | Fill #2

## 2017-06-18 ENCOUNTER — Telehealth: Payer: Self-pay | Admitting: Hematology and Oncology

## 2017-06-18 ENCOUNTER — Ambulatory Visit: Payer: 59 | Admitting: Hematology and Oncology

## 2017-06-18 NOTE — Telephone Encounter (Signed)
Patient called to reschedule due to snow monday

## 2017-06-22 ENCOUNTER — Ambulatory Visit: Payer: 59 | Admitting: Hematology and Oncology

## 2017-06-29 ENCOUNTER — Inpatient Hospital Stay: Payer: 59 | Attending: Hematology and Oncology | Admitting: Hematology and Oncology

## 2017-06-29 ENCOUNTER — Telehealth: Payer: Self-pay | Admitting: Hematology and Oncology

## 2017-06-29 DIAGNOSIS — Z170 Estrogen receptor positive status [ER+]: Secondary | ICD-10-CM | POA: Diagnosis not present

## 2017-06-29 DIAGNOSIS — Z79899 Other long term (current) drug therapy: Secondary | ICD-10-CM | POA: Insufficient documentation

## 2017-06-29 DIAGNOSIS — R232 Flushing: Secondary | ICD-10-CM | POA: Insufficient documentation

## 2017-06-29 DIAGNOSIS — Z923 Personal history of irradiation: Secondary | ICD-10-CM | POA: Diagnosis not present

## 2017-06-29 DIAGNOSIS — C50212 Malignant neoplasm of upper-inner quadrant of left female breast: Secondary | ICD-10-CM | POA: Diagnosis not present

## 2017-06-29 DIAGNOSIS — Z79810 Long term (current) use of selective estrogen receptor modulators (SERMs): Secondary | ICD-10-CM | POA: Diagnosis not present

## 2017-06-29 MED ORDER — TAMOXIFEN CITRATE 20 MG PO TABS: 20.0000 mg | ORAL_TABLET | Freq: Every day | ORAL | 3 refills | Status: DC

## 2017-06-29 NOTE — Assessment & Plan Note (Signed)
Left lumpectomy 08/03/2015 : Intermediate to high-grade DCIS with comedonecrosis, DCIS close superior margin, excision left superior margin DCIS less than 0.1 cm, additional superior margin still showed DCIS focally positive, ER 100%, PR 15% Reexcision of margins 08/17/2015: 0.2 cm residual DCIS but final margins negative Pathologic stage: Tis N0 stage 0 Adjuvant radiation therapy from 09/26/2015 to 10/24/2015  Current treatment: Tamoxifen 20 mg daily 5 years started 11/23/2015  Tamoxifen toxicities: 1. Hot flashes moderate degree Denies any musculoskeletal aches and pains.  Breast Cancer Surveillance: 1. Breast exam on 06/29/2017: Normal 2. Mammogram 05/08/2017 No abnormalities. Postsurgical changes. Breast Density Category C.  Return to clinic in 1 year for follow-up

## 2017-06-29 NOTE — Progress Notes (Signed)
Patient Care Team: Nanci Pina, FNP as PCP - General (Nurse Practitioner) Jovita Kussmaul, MD as Consulting Physician (General Surgery) Nicholas Lose, MD as Consulting Physician (Hematology and Oncology) Thea Silversmith, MD (Inactive) as Consulting Physician (Radiation Oncology) Sylvan Cheese, NP as Nurse Practitioner (Hematology and Oncology)  DIAGNOSIS:  Encounter Diagnosis  Name Primary?  . Malignant neoplasm of upper-inner quadrant of left breast in female, estrogen receptor positive ([place])     SUMMARY OF ONCOLOGIC HISTORY:   Breast cancer of upper-inner quadrant of left female breast ([place])   06/25/2015 Mammogram    Screening detected left breast calcifications posterior depth 4.4 x 3.3 x 2.3 cm      06/27/2015 Initial Diagnosis    Left breast biopsy upper quadrant: DCIS with comedo necrosis and calcification, grade 2-3, ER 100%, PR 15%, Tis N0 stage 0 clinical stage      08/03/2015 Surgery    Left lumpectomy Marlou Starks): Intermediate to high-grade DCIS with comedonecrosis, DCIS close superior margin, excision left superior margin DCIS less than 0.1 cm, additional superior margin still showed DCIS focally positive, ER 100%, PR 15%      08/17/2015 Surgery    Left superior margin excision: Less than 0.2 cm residual high-grade DCIS; left additional superior/medial margin: Benign      09/26/2015 - 10/24/2015 Radiation Therapy    Adjuvant radiation therapy [name]). Left breast/ 42.72 Gy at 2.67 Gy per fraction x 16 fractions. Left breast boost/ 10 Gy at 2 Gy per fraction x 5 fractions      11/23/2015 -  Anti-estrogen oral therapy    Tamoxifen 20 mg daily 5 years       CHIEF COMPLIANT: Annual follow-up of DCIS on tamoxifen therapy  INTERVAL HISTORY: Erin Bowers is a 52 year old with above-mentioned history of left breast DCIS who is currently on tamoxifen therapy and appears to be tolerating it fairly well.  She does have hot flashes and denies any arthralgias  or myalgias.  Denies any lumps or nodules in the breast.  REVIEW OF SYSTEMS:   Constitutional: Denies fevers, chills or abnormal weight loss Eyes: Denies blurriness of vision Ears, nose, mouth, throat, and face: Denies mucositis or sore throat Respiratory: Denies cough, dyspnea or wheezes Cardiovascular: Denies palpitation, chest discomfort Gastrointestinal:  Denies nausea, heartburn or change in bowel habits Skin: Denies abnormal skin rashes Lymphatics: Denies new lymphadenopathy or easy bruising Neurological:Denies numbness, tingling or new weaknesses Behavioral/Psych: Mood is stable, no new changes  Extremities: No lower extremity edema Breast:  denies any pain or lumps or nodules in either breasts All other systems were reviewed with the patient and are negative.  I have reviewed the past medical history, past surgical history, social history and family history with the patient and they are unchanged from previous note.  ALLERGIES:  is allergic to adhesive [tape].  MEDICATIONS:  Current Outpatient Medications  Medication Sig Dispense Refill  . Ascorbic Acid (VITAMIN C) 1000 MG tablet Take 1,000 mg by mouth daily.    . Cholecalciferol (D3-1000) 1000 units capsule Take 1,000 Units by mouth daily.    . Multiple Vitamin (MULTIVITAMIN) tablet Take 1 tablet by mouth daily.    Marland Kitchen olmesartan-hydrochlorothiazide (BENICAR HCT) 20-12.5 MG tablet Reported on 10/23/2015  3  . pyridOXINE (VITAMIN B-6) 100 MG tablet Take 100 mg by mouth daily.    . tamoxifen (NOLVADEX) 20 MG tablet Take 1 tablet (20 mg total) by mouth daily. 90 tablet 3  . UNABLE TO FIND Take 500 mg  by mouth daily. Mega Red    . vitamin B-12 (CYANOCOBALAMIN) 1000 MCG tablet Take 1,000 mcg by mouth daily.     No current facility-administered medications for this visit.     PHYSICAL EXAMINATION: ECOG PERFORMANCE STATUS: 1 - Symptomatic but completely ambulatory  Vitals:   06/29/17 1527  BP: (!) 134/91  Pulse: 86  Resp: 18   Temp: 98.5 F (36.9 C)  SpO2: 100%   Filed Weights   06/29/17 1527  Weight: 178 lb 11.2 oz (81.1 kg)    GENERAL:alert, no distress and comfortable SKIN: skin color, texture, turgor are normal, no rashes or significant lesions EYES: normal, Conjunctiva are pink and non-injected, sclera clear OROPHARYNX:no exudate, no erythema and lips, buccal mucosa, and tongue normal  NECK: supple, thyroid normal size, non-tender, without nodularity LYMPH:  no palpable lymphadenopathy in the cervical, axillary or inguinal LUNGS: clear to auscultation and percussion with normal breathing effort HEART: regular rate & rhythm and no murmurs and no lower extremity edema ABDOMEN:abdomen soft, non-tender and normal bowel sounds MUSCULOSKELETAL:no cyanosis of digits and no clubbing  NEURO: alert & oriented x 3 with fluent speech, no focal motor/sensory deficits EXTREMITIES: No lower extremity edema BREAST: No palpable masses or nodules in either right or left breasts. No palpable axillary supraclavicular or infraclavicular adenopathy no breast tenderness or nipple discharge. (exam performed in the presence of a chaperone)  LABORATORY DATA:  I have reviewed the data as listed CMP Latest Ref Rng & Units 07/04/2015  Glucose 70 - 140 mg/dl 76  BUN 7.0 - 26.0 mg/dL 15.9  Creatinine 0.6 - 1.1 mg/dL 0.8  Sodium 136 - 145 mEq/L 143  Potassium 3.5 - 5.1 mEq/L 3.7  CO2 22 - 29 mEq/L 30(H)  Calcium 8.4 - 10.4 mg/dL 10.1  Total Protein 6.4 - 8.3 g/dL 8.0  Total Bilirubin 0.20 - 1.20 mg/dL 0.33  Alkaline Phos 40 - 150 U/L 73  AST 5 - 34 U/L 19  ALT 0 - 55 U/L 20    Lab Results  Component Value Date   WBC 5.6 07/04/2015   HGB 12.8 07/04/2015   HCT 38.3 07/04/2015   MCV 81.2 07/04/2015   PLT 289 07/04/2015   NEUTROABS 2.5 07/04/2015    ASSESSMENT & PLAN:  Breast cancer of upper-inner quadrant of left female breast ([place]) Left lumpectomy 08/03/2015 : Intermediate to high-grade DCIS with comedonecrosis,  DCIS close superior margin, excision left superior margin DCIS less than 0.1 cm, additional superior margin still showed DCIS focally positive, ER 100%, PR 15% Reexcision of margins 08/17/2015: 0.2 cm residual DCIS but final margins negative Pathologic stage: Tis N0 stage 0 Adjuvant radiation therapy from 09/26/2015 to 10/24/2015  Current treatment: Tamoxifen 20 mg daily 5 years started 11/23/2015  Tamoxifen toxicities: 1. Hot flashes moderate degree Denies any musculoskeletal aches and pains.  Breast Cancer Surveillance: 1. Breast exam on 06/29/2017: Normal 2. Mammogram 05/08/2017 No abnormalities. Postsurgical changes. Breast Density Category C.  Return to clinic in 1 year for follow-up  I spent 25 minutes talking to the patient of which more than half was spent in counseling and coordination of care.  No orders of the defined types were placed in this encounter.  The patient has a good understanding of the overall plan. she agrees with it. she will call with any problems that may develop before the next visit here.   Harriette Ohara, MD 06/29/17

## 2017-06-29 NOTE — Telephone Encounter (Signed)
Scheduled appt per 1/21 los - patient did not want avs or calendar with appts.

## 2017-08-10 MED FILL — TAMOXIFEN CITRATE 20 MG TAB: 20 | 90 days supply | Qty: 90 | Fill #0

## 2017-08-17 IMAGING — MG MM DIAGNOSTIC UNILATERAL L
2 series · 2 of 2 positions shown · non-contrast
Comparison: Previous exam(s).

CLINICAL DATA: Callback from screening mammogram for calcifications
left breast

EXAM:
DIGITAL DIAGNOSTIC LEFT MAMMOGRAM

[L CC]
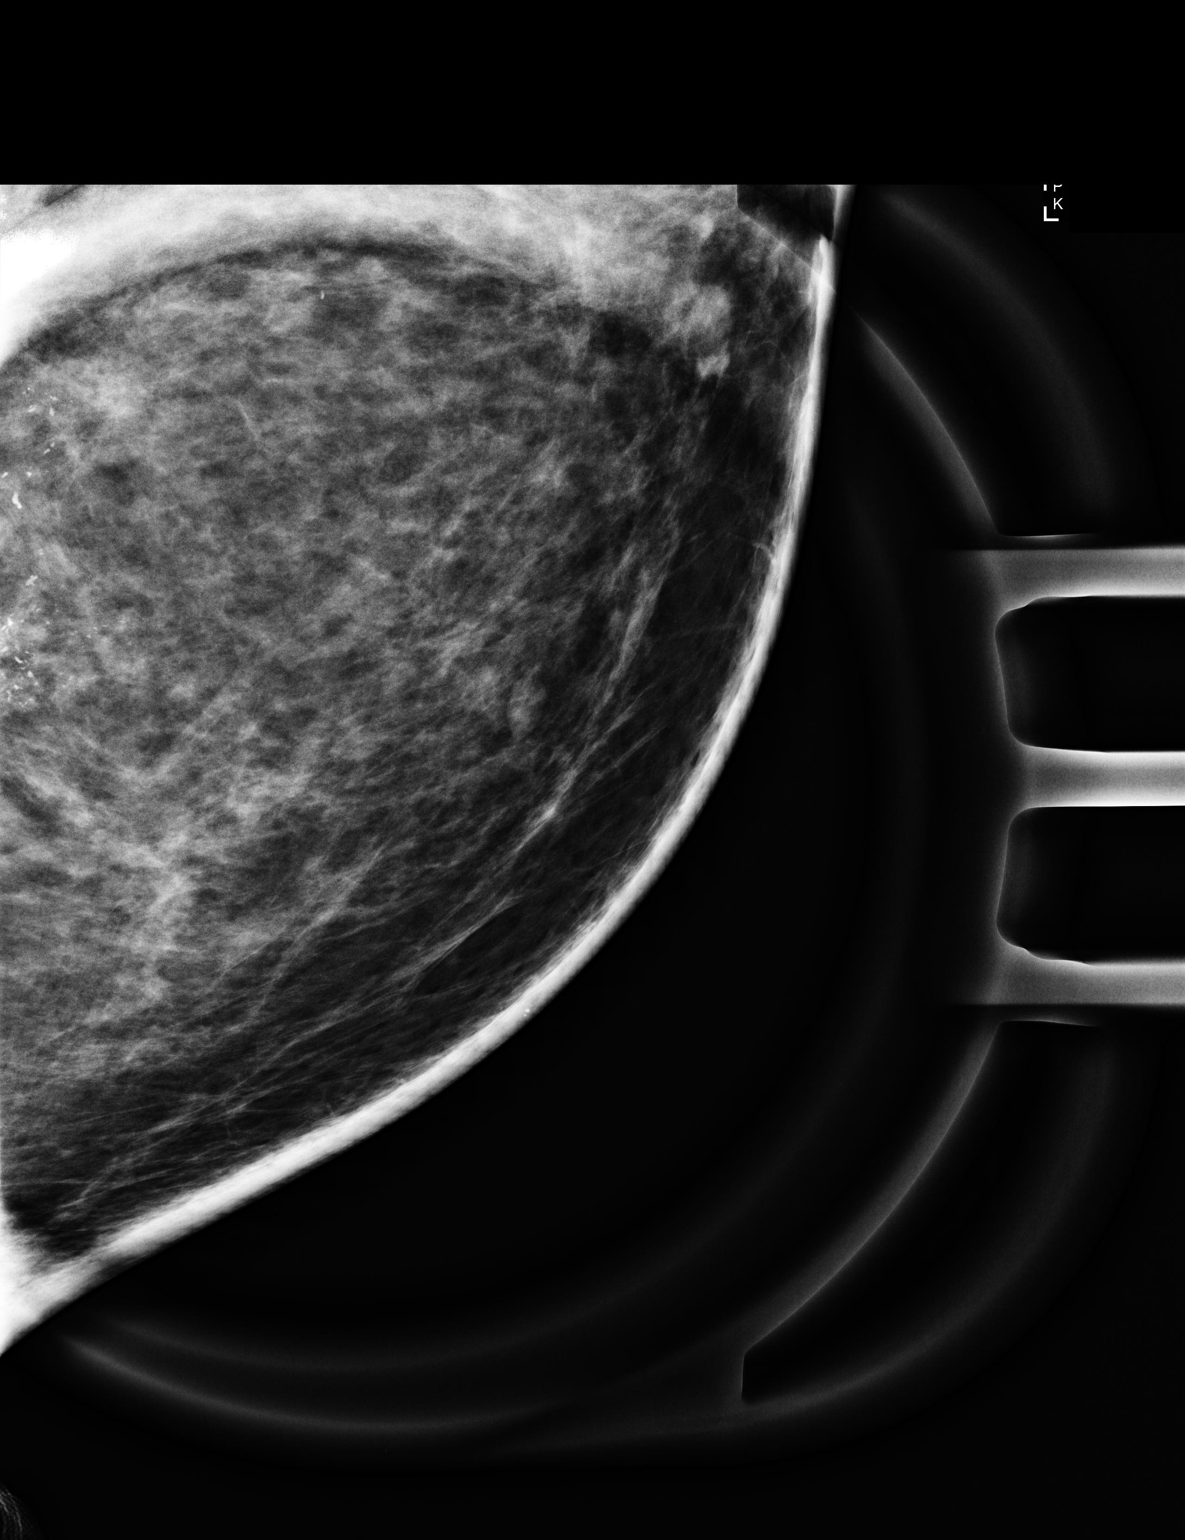

[L ML]
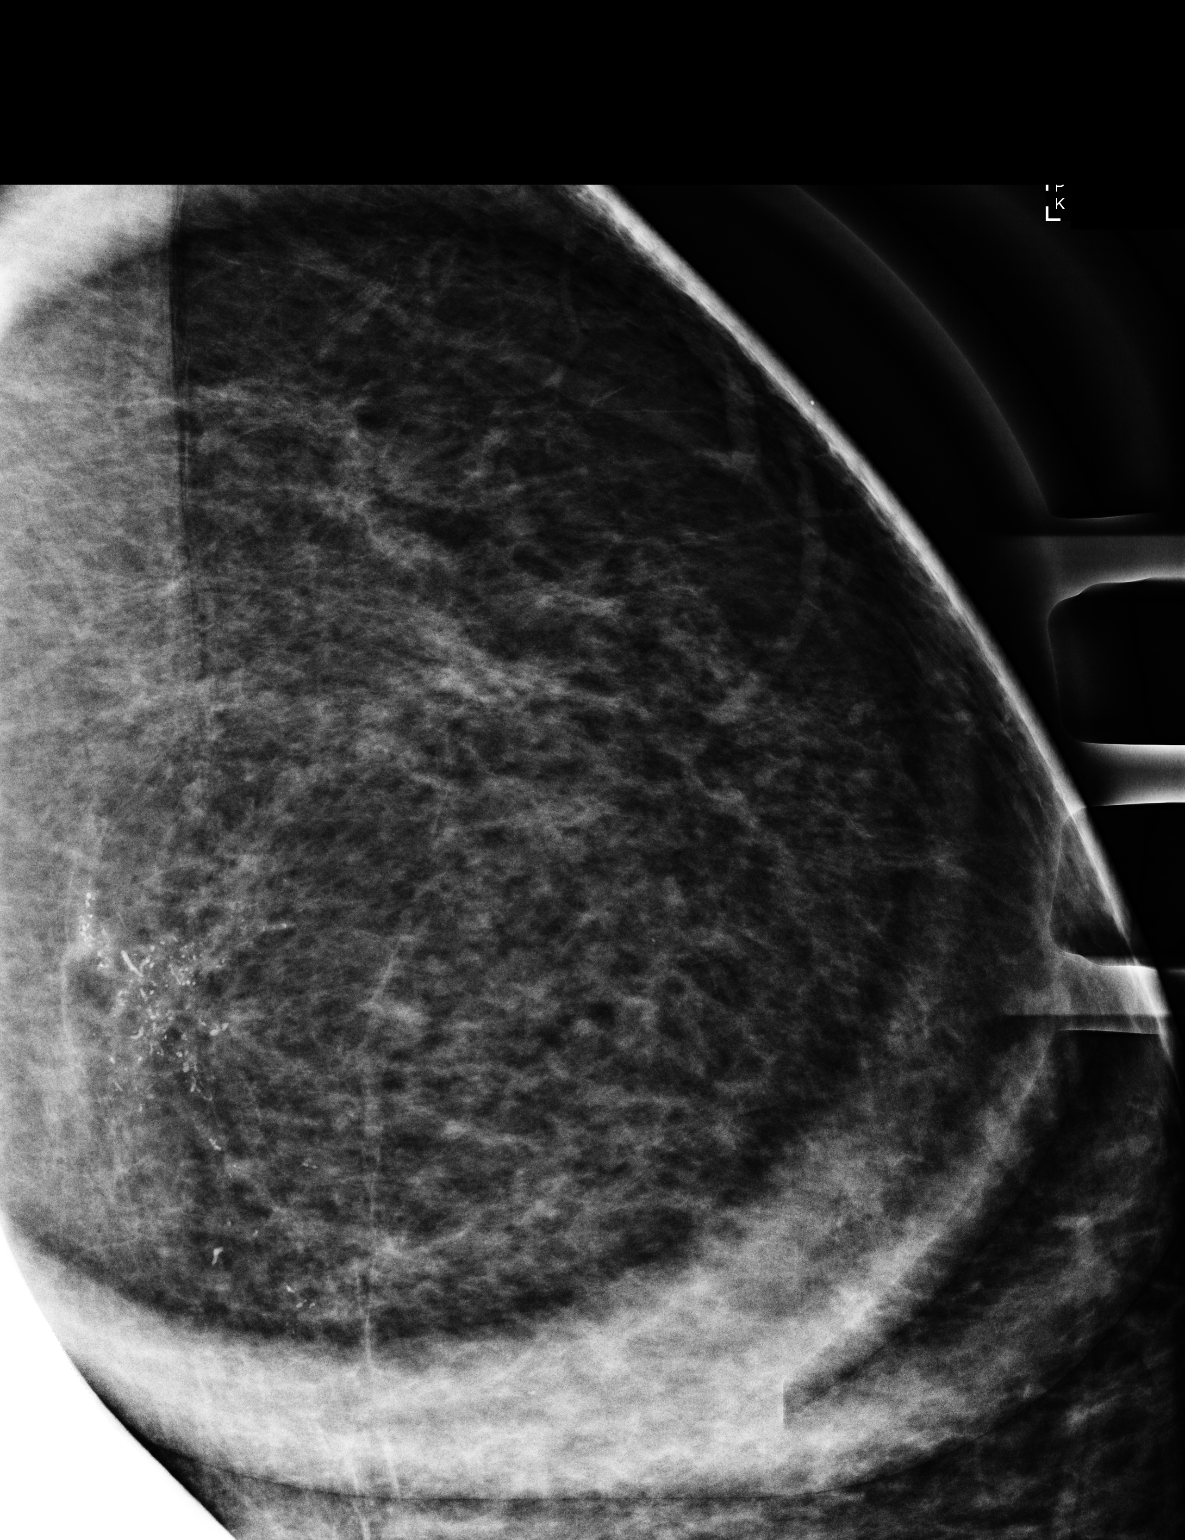

[2 of 2 positions shown; findings below may reference images not displayed]

ACR Breast Density Category c: The breast tissue is heterogeneously
dense, which may obscure small masses.
FINDINGS: Spot magnification CC and lateral views of the left breast are
submitted. There is a group of suspicious linear branching
microcalcifications in the posterior medial left breast at the 9
o'clock position measuring up 2.3 x 3.3 x 4.4 cm
IMPRESSION: Highly suspicious findings.

RECOMMENDATION:
Stereotactic core biopsy left breast calcifications. I explained to
the patient due to the posterior location of these calcifications,
stereotactic core biopsy may not be feasible but we would give it a
try. If stereotactic core biopsy cannot be performed, then an
excisional biopsy should be performed.

I have discussed the findings and recommendations with the patient.
Results were also provided in writing at the conclusion of the
visit. If applicable, a reminder letter will be sent to the patient
regarding the next appointment.

BI-RADS CATEGORY  5: Highly suggestive of malignancy.

## 2017-09-14 MED FILL — OLMESARTAN-HCTZ 20-12.5 MG: 20-12.5 | 90 days supply | Qty: 90 | Fill #3

## 2017-10-20 IMAGING — MG MM LT PLC BREAST LOC DEV 1ST LESION
6 series · 6 of 6 positions shown · non-contrast
Comparison: Previous exams.

CLINICAL DATA: Left breast cancer for bracketed needle
localization.

EXAM:
NEEDLE LOCALIZATION OF THE LEFT BREAST WITH MAMMO GUIDANCE

[L CC (1 of 3)]
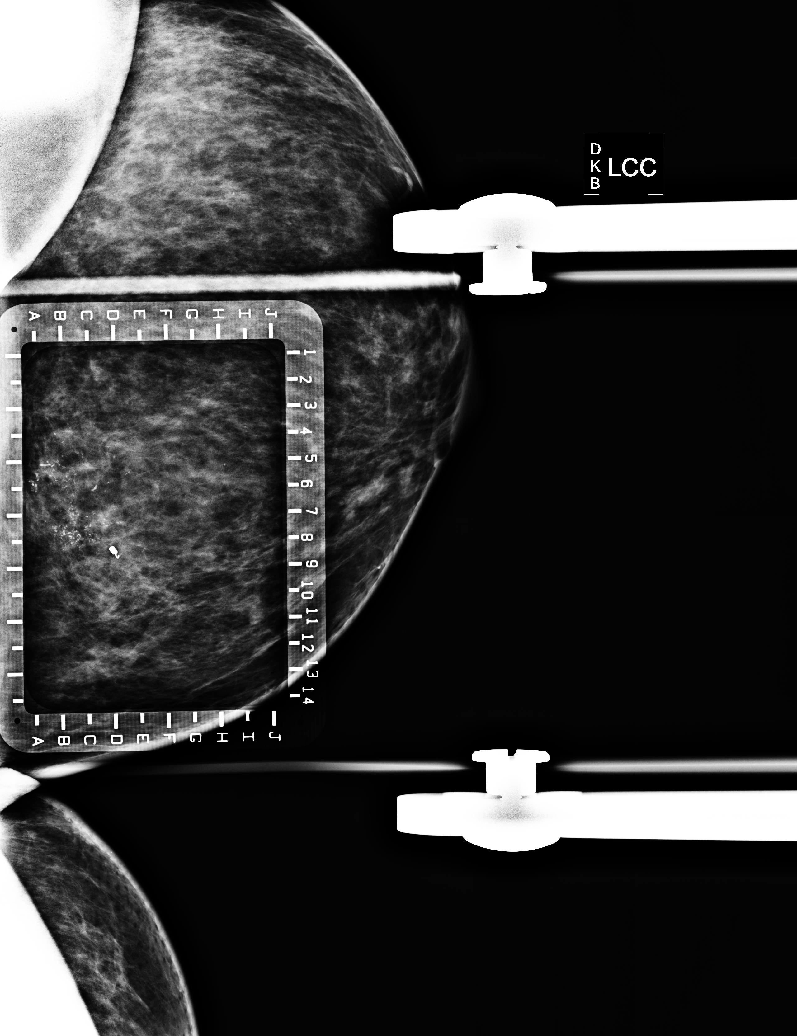

[L CC (2 of 3)]
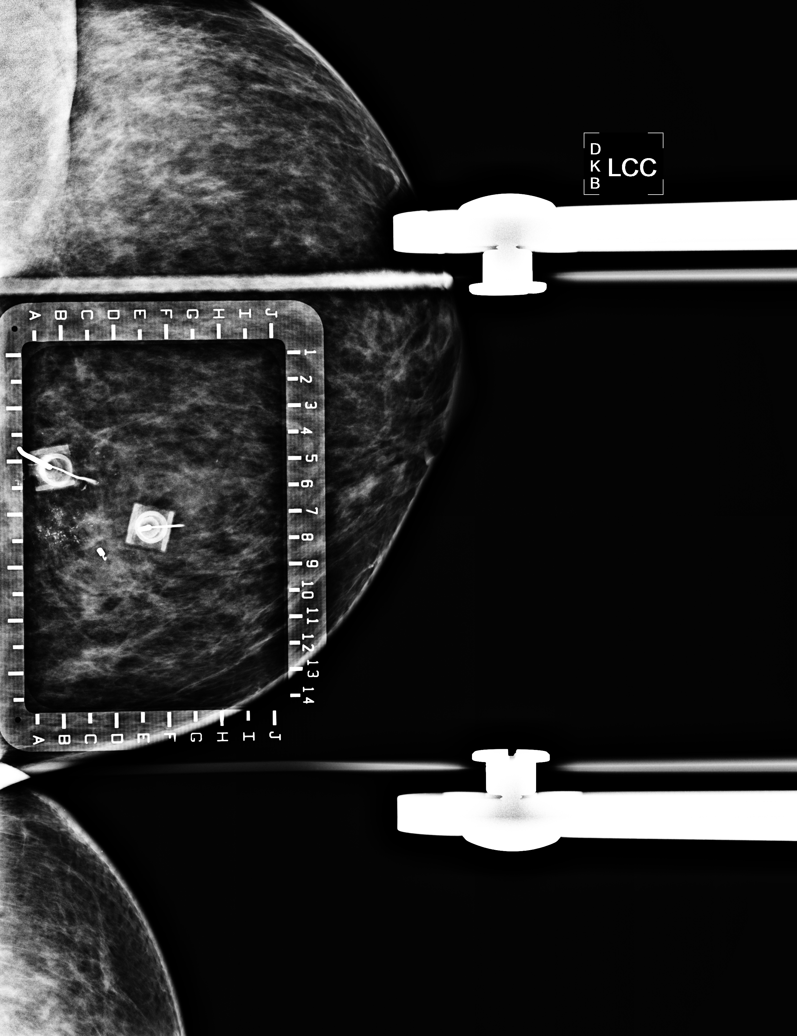

[L ML (1 of 3)]
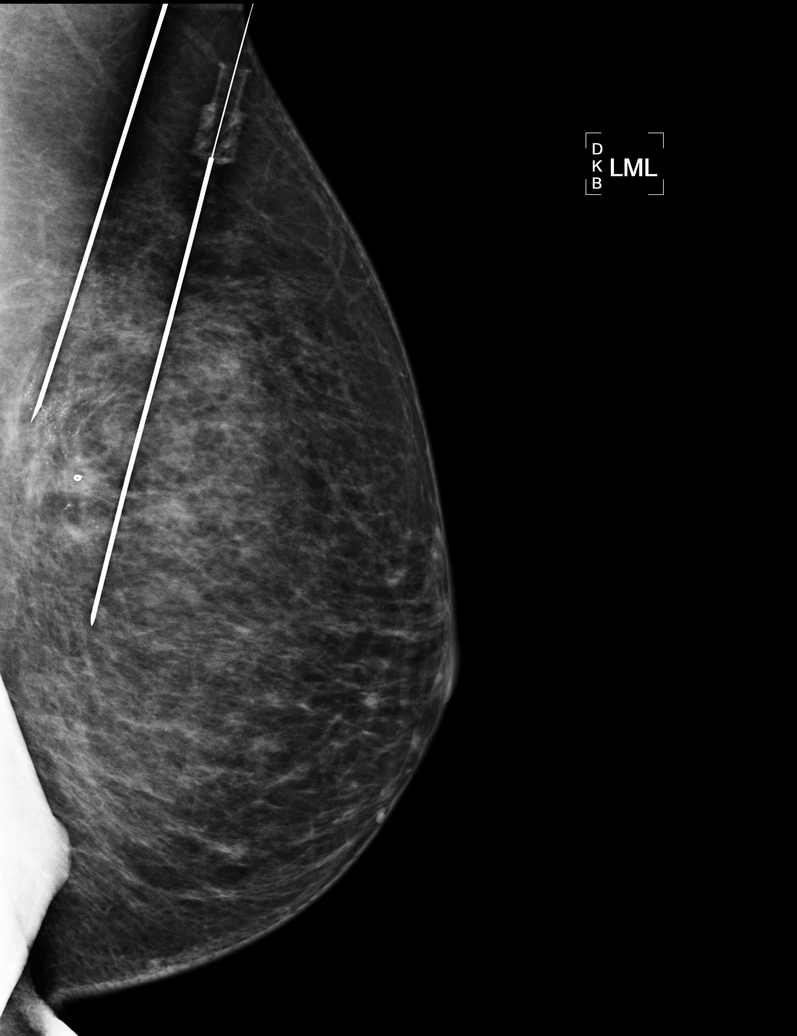

[L ML (2 of 3)]
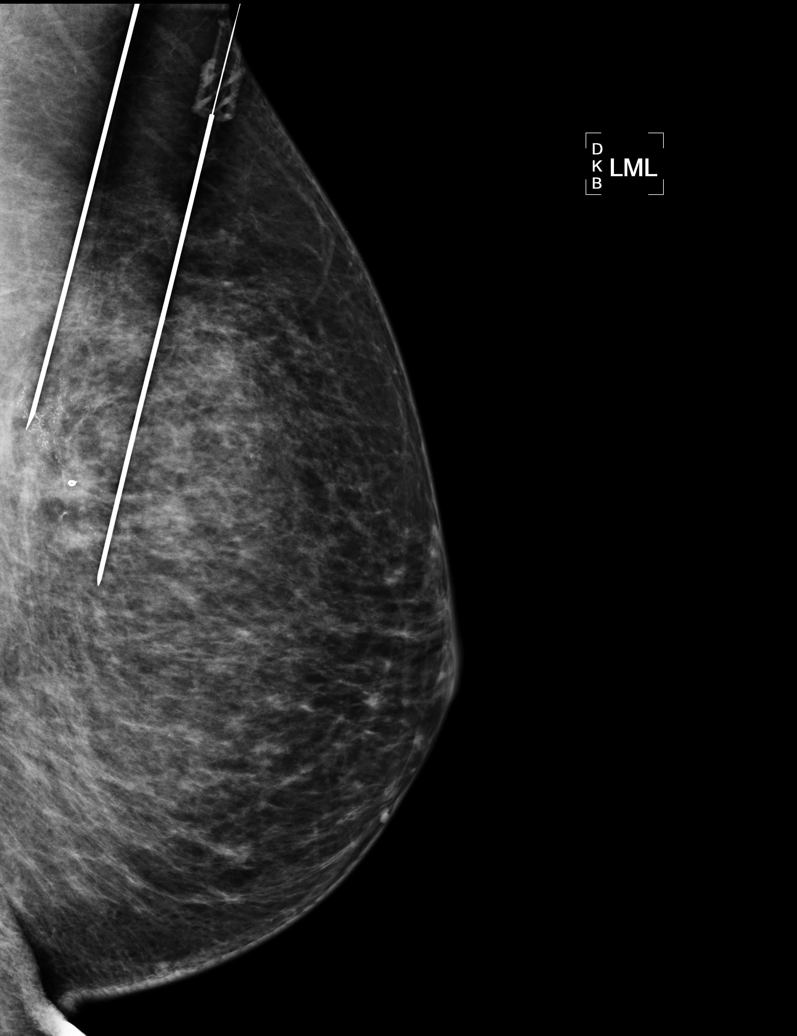

[L CC (3 of 3)]
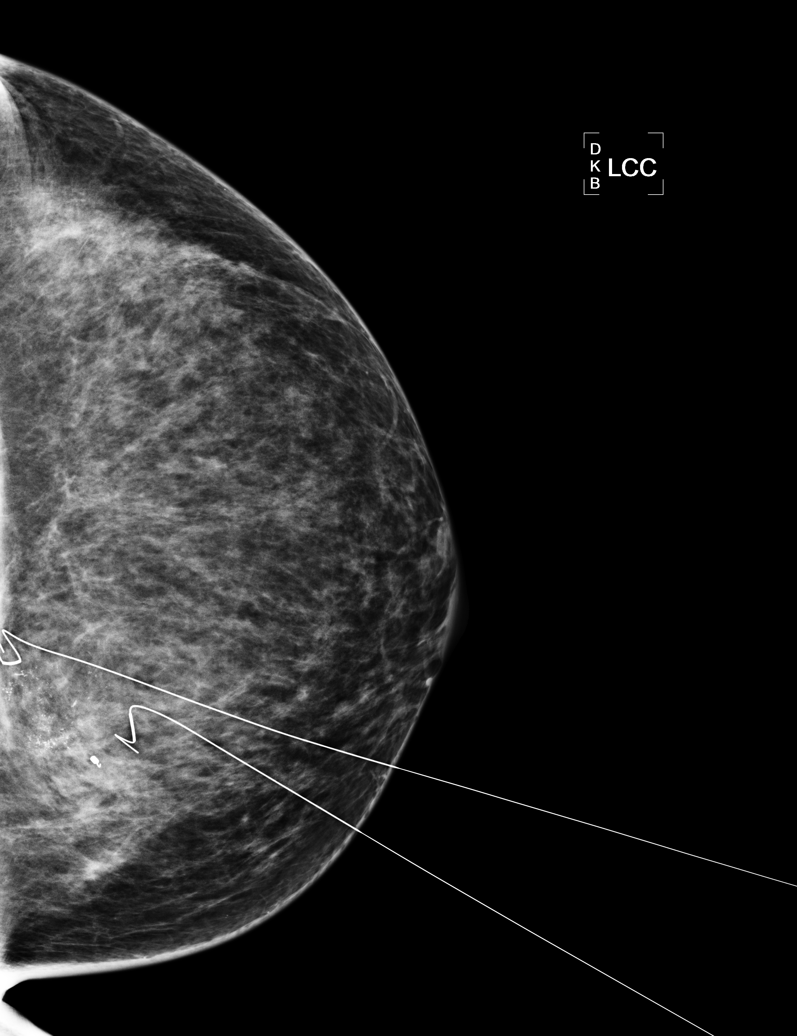

[L ML (3 of 3)]
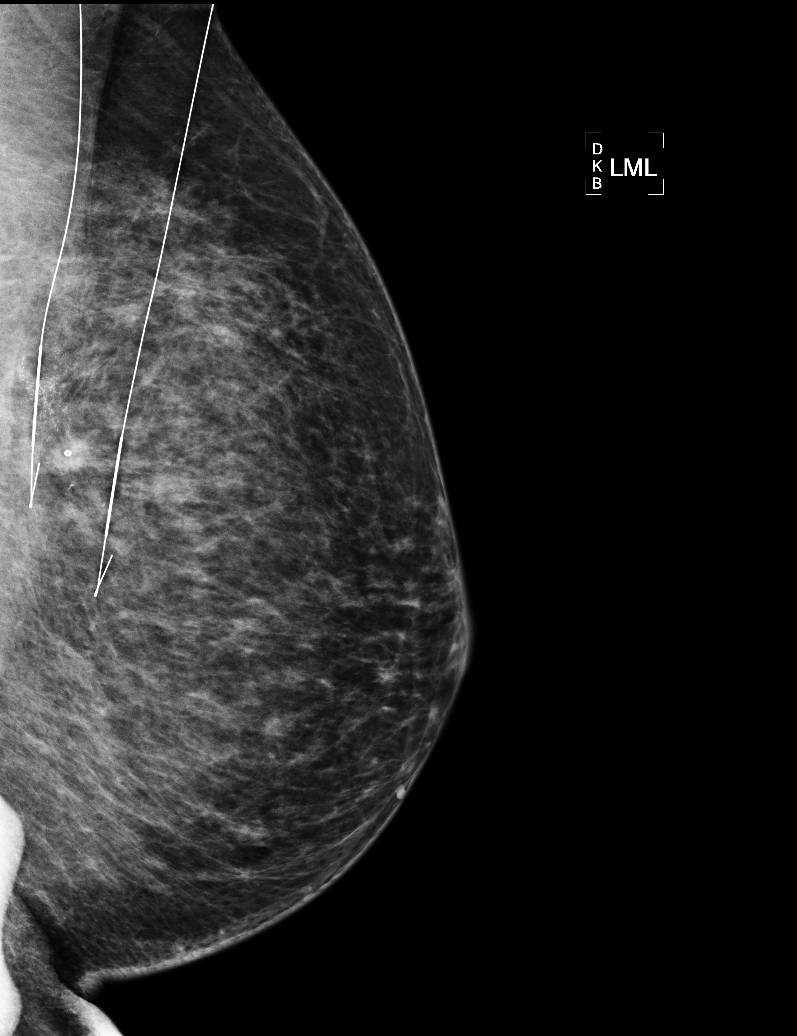

[6 of 6 positions shown; findings below may reference images not displayed]

FINDINGS: Patient presents for needle localization prior to left breast
surgery. I met with the patient and we discussed the procedure of
needle localization including benefits and alternatives. We
discussed the high likelihood of a successful procedure. We
discussed the risks of the procedure, including infection, bleeding,
tissue injury, and further surgery. Informed, written consent was
given. The usual time-out protocol was performed immediately prior
to the procedure.

Using mammographic guidance, sterile technique, 1% lidocaine and
two9 cm modified Kopans needles, the calcifications in the medial
upper left breast bracket localized using cranial approach. The
images were marked for Dr. Keawjing.
IMPRESSION: Needle localization left breast. No apparent complications.

## 2017-11-10 MED FILL — TAMOXIFEN CITRATE 20 MG TAB: 20 | 90 days supply | Qty: 90 | Fill #1

## 2017-11-25 DIAGNOSIS — E785 Hyperlipidemia, unspecified: Secondary | ICD-10-CM | POA: Diagnosis not present

## 2017-11-25 DIAGNOSIS — E559 Vitamin D deficiency, unspecified: Secondary | ICD-10-CM | POA: Diagnosis not present

## 2017-11-25 DIAGNOSIS — C50912 Malignant neoplasm of unspecified site of left female breast: Secondary | ICD-10-CM | POA: Diagnosis not present

## 2017-11-25 DIAGNOSIS — I10 Essential (primary) hypertension: Secondary | ICD-10-CM | POA: Diagnosis not present

## 2017-12-03 DIAGNOSIS — I10 Essential (primary) hypertension: Secondary | ICD-10-CM | POA: Diagnosis not present

## 2017-12-03 DIAGNOSIS — Z0000 Encounter for general adult medical examination without abnormal findings: Secondary | ICD-10-CM | POA: Diagnosis not present

## 2017-12-03 DIAGNOSIS — E785 Hyperlipidemia, unspecified: Secondary | ICD-10-CM | POA: Diagnosis not present

## 2017-12-14 MED FILL — OLMESARTAN-HCTZ 20-12.5 MG: 20-12.5 | 90 days supply | Qty: 90 | Fill #0

## 2018-02-09 MED FILL — TAMOXIFEN CITRATE 20 MG TAB: 20 | 90 days supply | Qty: 90 | Fill #2

## 2018-03-04 DIAGNOSIS — R7309 Other abnormal glucose: Secondary | ICD-10-CM

## 2018-03-04 DIAGNOSIS — I10 Essential (primary) hypertension: Secondary | ICD-10-CM

## 2018-03-04 DIAGNOSIS — E785 Hyperlipidemia, unspecified: Secondary | ICD-10-CM

## 2018-03-04 MED FILL — OLMESARTAN-HCTZ 20-12.5 MG: 20-12.5 | 90 days supply | Qty: 90 | Fill #0

## 2018-04-05 ENCOUNTER — Other Ambulatory Visit: Payer: Self-pay | Admitting: Internal Medicine

## 2018-04-05 DIAGNOSIS — Z853 Personal history of malignant neoplasm of breast: Secondary | ICD-10-CM

## 2018-05-10 MED FILL — TAMOXIFEN CITRATE 20 MG TAB: 20 | 90 days supply | Qty: 90 | Fill #3

## 2018-05-19 ENCOUNTER — Encounter: Payer: Self-pay | Admitting: Obstetrics & Gynecology

## 2018-05-19 ENCOUNTER — Ambulatory Visit (INDEPENDENT_AMBULATORY_CARE_PROVIDER_SITE_OTHER): Payer: 59 | Admitting: Obstetrics & Gynecology

## 2018-05-19 ENCOUNTER — Ambulatory Visit
Admission: RE | Admit: 2018-05-19 | Discharge: 2018-05-19 | Disposition: A | Discharge status: Home / Self Care | Payer: 59 | Source: Ambulatory Visit | Attending: Internal Medicine | Admitting: Internal Medicine

## 2018-05-19 ENCOUNTER — Encounter: Payer: Self-pay | Admitting: *Deleted

## 2018-05-19 ENCOUNTER — Encounter: Payer: Self-pay | Admitting: Internal Medicine

## 2018-05-19 ENCOUNTER — Ambulatory Visit: Payer: 59 | Admitting: Internal Medicine

## 2018-05-19 VITALS — BP 130/76 | HR 57 | Temp 98.2°F | Ht 64.0 in | Wt 177.4 lb

## 2018-05-19 VITALS — BP 140/90 | Ht 63.5 in | Wt 173.0 lb

## 2018-05-19 DIAGNOSIS — Z853 Personal history of malignant neoplasm of breast: Secondary | ICD-10-CM

## 2018-05-19 DIAGNOSIS — Z170 Estrogen receptor positive status [ER+]: Secondary | ICD-10-CM | POA: Diagnosis not present

## 2018-05-19 DIAGNOSIS — Z90710 Acquired absence of both cervix and uterus: Secondary | ICD-10-CM

## 2018-05-19 DIAGNOSIS — I10 Essential (primary) hypertension: Secondary | ICD-10-CM | POA: Diagnosis not present

## 2018-05-19 DIAGNOSIS — C50212 Malignant neoplasm of upper-inner quadrant of left female breast: Secondary | ICD-10-CM

## 2018-05-19 DIAGNOSIS — R928 Other abnormal and inconclusive findings on diagnostic imaging of breast: Secondary | ICD-10-CM | POA: Diagnosis not present

## 2018-05-19 DIAGNOSIS — Z1272 Encounter for screening for malignant neoplasm of vagina: Secondary | ICD-10-CM

## 2018-05-19 DIAGNOSIS — Z01419 Encounter for gynecological examination (general) (routine) without abnormal findings: Secondary | ICD-10-CM

## 2018-05-19 MED ORDER — OLMESARTAN MEDOXOMIL-HCTZ 20-12.5 MG PO TABS: ORAL_TABLET | ORAL | 0 refills | Status: DC

## 2018-05-19 MED FILL — OLMESARTAN-HCTZ 20-12.5 MG: 20-12.5 | 90 days supply | Qty: 90 | Fill #0

## 2018-05-19 NOTE — Progress Notes (Signed)
  Subjective:     Patient ID: Erin Bowers , female    DOB: 04-Oct-1965 , 52 y.o.   MRN: [id number]   Chief Complaint  Patient presents with  . Hypertension    HPI  Pt is here for HTN FU. She does not monitor her BP. Denies having any complaints.   Past Medical History:  Diagnosis Date  . Anxiety   . Breast cancer ([place])   . Hypertension   . Personal history of radiation therapy 2017     Family History  Problem Relation Age of Onset  . Stroke Mother   . Stroke Father      Current Outpatient Medications:  .  Ascorbic Acid (VITAMIN C) 1000 MG tablet, Take 1,000 mg by mouth daily., Disp: , Rfl:  .  Cholecalciferol (D3-1000) 1000 units capsule, Take 1,000 Units by mouth daily., Disp: , Rfl:  .  Multiple Vitamin (MULTIVITAMIN) tablet, Take 1 tablet by mouth daily., Disp: , Rfl:  .  olmesartan-hydrochlorothiazide (BENICAR HCT) 20-12.5 MG tablet, Reported on 10/23/2015, Disp: , Rfl: 3 .  tamoxifen (NOLVADEX) 20 MG tablet, Take 1 tablet (20 mg total) by mouth daily., Disp: 90 tablet, Rfl: 3 .  UNABLE TO FIND, Take 500 mg by mouth daily. Mega Red, Disp: , Rfl:    Allergies  Allergen Reactions  . Adhesive [Tape] Other (See Comments)    Steri-strips cause blisters      Review of Systems  Constitutional: Negative for activity change, appetite change and diaphoresis.  HENT: Negative.  Negative for tinnitus.   Eyes: Negative for visual disturbance.  Respiratory: Negative for cough, chest tightness and shortness of breath.   Cardiovascular: Negative for chest pain, palpitations and leg swelling.  Genitourinary: Negative for dysuria, frequency and urgency.       Has usual hot flushes  Neurological: Negative for speech difficulty, weakness, numbness and headaches.  Hematological: Negative for adenopathy.     Today's Vitals   05/19/18 0839  BP: 130/76  Pulse: (!) 57  Temp: 98.2 F (36.8 C)  TempSrc: Oral  SpO2: 96%  Weight: 177 lb 6.4 oz (80.5 kg)  Height: 5\' 4"  (1.626 m)    Body mass index is 30.45 kg/m.   Objective:  Physical Exam   Constitutional: She is oriented to person, place, and time. She appears well-developed and well-nourished. No distress.  HENT:  Head: Normocephalic and atraumatic.  Right Ear: External ear normal.  Left Ear: External ear normal.  Nose: Nose normal.  Eyes: Conjunctivae are normal. Right eye exhibits no discharge. Left eye exhibits no discharge. No scleral icterus.  Neck: Neck supple. No thyromegaly present.  No carotid bruits bilaterally  Cardiovascular: Normal rate and regular rhythm.  No murmur heard. Pulmonary/Chest: Effort normal and breath sounds normal. No respiratory distress.  Musculoskeletal: Normal range of motion. She exhibits no edema.  Lymphadenopathy:    She has no cervical adenopathy.  Neurological: She is alert and oriented to person, place, and time.  Skin: Skin is warm and dry. Capillary refill takes less than 2 seconds. No rash noted. She is not diaphoretic.  Psychiatric: She has a normal mood and affect. Her behavior is normal. Judgment and thought content normal.  Nursing note reviewed.     Assessment And Plan:     1. Essential hypertension - CMP14 + Anion Gap - CBC no Diff  May continue same meds. FU in 3 months.  Kush Farabee RODRIGUEZ-SOUTHWORTH, PA-C

## 2018-05-19 NOTE — Patient Instructions (Signed)
1. Encounter for Papanicolaou smear of vagina as part of routine gynecological examination Gynecologic exam status post TAH.  Pap reflex done on the vaginal vault.  Breast exam status post left breast lumpectomy within normal today.  Mammogram December 2019 benign.  Referred for screening colonoscopy.  Health labs done today with family physician.  Body mass index 30.16.  Recommend increase physical activity with aerobic activities 5 times a week and weightlifting every 2 days.  Recommend lower calorie/carb diet such as [employer].  2. S/P TAH (total abdominal hysterectomy)  3. Malignant neoplasm of upper-inner quadrant of left breast in female, estrogen receptor positive ([place]) On tamoxifen.  Followed by Dr. Lindi Adie.  Referred for Genetic counseling/screening.  Other orders - Omega-3 Fatty Acids (FISH OIL) 1000 MG CAPS; Take by mouth.  Marquise, it was a pleasure seeing you today!  I will inform you of your results as soon as they are available.

## 2018-05-19 NOTE — Progress Notes (Signed)
Erin Bowers [birth year] [id number]   History:    52 y.o. G0 Divorced  RP:  New (>3 yrs) patient presenting for annual gyn exam   HPI: Status post TAH with cauterization of the left ovarian endometrioma in 2005.  No pelvic pain.  Currently abstinent.  Left high-grade DCIS with lumpectomy in 2017.  Had radiation therapy and is now on tamoxifen.  No genetic screening done.  Breast normal currently.  Body mass index 30.16.  Not very physically active.  Health labs done today with family physician.  Past medical history,surgical history, family history and social history were all reviewed and documented in the EPIC chart.  Gynecologic History No LMP recorded. Patient has had a hysterectomy. Contraception: status post hysterectomy Last Pap: 2013. Results were: normal Last mammogram: 05/2018. Results were: Benign Bone Density: Never Colonoscopy: Not yet, will schedule  Obstetric History OB History  Gravida Para Term Preterm AB Living  0 0 0 0 0 0  SAB TAB Ectopic Multiple Live Births  0 0 0 0 0     ROS: A ROS was performed and pertinent positives and negatives are included in the history.  GENERAL: No fevers or chills. HEENT: No change in vision, no earache, sore throat or sinus congestion. NECK: No pain or stiffness. CARDIOVASCULAR: No chest pain or pressure. No palpitations. PULMONARY: No shortness of breath, cough or wheeze. GASTROINTESTINAL: No abdominal pain, nausea, vomiting or diarrhea, melena or bright red blood per rectum. GENITOURINARY: No urinary frequency, urgency, hesitancy or dysuria. MUSCULOSKELETAL: No joint or muscle pain, no back pain, no recent trauma. DERMATOLOGIC: No rash, no itching, no lesions. ENDOCRINE: No polyuria, polydipsia, no heat or cold intolerance. No recent change in weight. HEMATOLOGICAL: No anemia or easy bruising or bleeding. NEUROLOGIC: No headache, seizures, numbness, tingling or weakness. PSYCHIATRIC: No depression, no loss of interest in normal  activity or change in sleep pattern.     Exam:   BP 140/90   Ht 5' 3.5" (1.613 m)   Wt 173 lb (78.5 kg)   BMI 30.16 kg/m   Body mass index is 30.16 kg/m.  General appearance : Well developed well nourished female. No acute distress HEENT: Eyes: no retinal hemorrhage or exudates,  Neck supple, trachea midline, no carotid bruits, no thyroidmegaly Lungs: Clear to auscultation, no rhonchi or wheezes, or rib retractions  Heart: Regular rate and rhythm, no murmurs or gallops Breast:Examined in sitting and supine position were symmetrical in appearance, no palpable masses or tenderness,  no skin retraction, no nipple inversion, no nipple discharge, no skin discoloration, no axillary or supraclavicular lymphadenopathy Abdomen: no palpable masses or tenderness, no rebound or guarding Extremities: no edema or skin discoloration or tenderness  Pelvic: Vulva: Normal             Vagina: No gross lesions or discharge  Cervix/Uterus absent.  Pap reflex done  Adnexa  Without masses or tenderness  Anus: Normal   Assessment/Plan:  52 y.o. female for annual exam   1. Encounter for Papanicolaou smear of vagina as part of routine gynecological examination Gynecologic exam status post TAH.  Pap reflex done on the vaginal vault.  Breast exam status post left breast lumpectomy within normal today.  Mammogram December 2019 benign.  Referred for screening colonoscopy.  Health labs done today with family physician.  Body mass index 30.16.  Recommend increase physical activity with aerobic activities 5 times a week and weightlifting every 2 days.  Recommend lower calorie/carb diet such as [place].  2. S/P TAH (total abdominal hysterectomy)  3. Malignant neoplasm of upper-inner quadrant of left breast in female, estrogen receptor positive ([place]) On tamoxifen.  Followed by Dr. Lindi Adie.  Referred for Genetic counseling/screening.  Other orders - Omega-3 Fatty Acids (FISH OIL) 1000 MG CAPS; Take by  mouth.  Princess Bruins MD, 11:26 AM 05/19/2018

## 2018-05-19 NOTE — Addendum Note (Signed)
Addended by: Thurnell Garbe A on: 05/19/2018 12:25 PM   Modules accepted: Orders

## 2018-05-20 ENCOUNTER — Telehealth: Payer: Self-pay | Admitting: *Deleted

## 2018-05-20 DIAGNOSIS — C50212 Malignant neoplasm of upper-inner quadrant of left female breast: Secondary | ICD-10-CM

## 2018-05-20 DIAGNOSIS — Z170 Estrogen receptor positive status [ER+]: Principal | ICD-10-CM

## 2018-05-20 LAB — CBC
HEMATOCRIT: 37.4 % (ref 34.0–46.6)
Hemoglobin: 12.3 g/dL (ref 11.1–15.9)
MCH: 28.3 pg (ref 26.6–33.0)
MCHC: 32.9 g/dL (ref 31.5–35.7)
MCV: 86 fL (ref 79–97)
Platelets: 289 10*3/uL (ref 150–450)
RBC: 4.35 x10E6/uL (ref 3.77–5.28)
RDW: 13.5 % (ref 12.3–15.4)
WBC: 5.3 10*3/uL (ref 3.4–10.8)

## 2018-05-20 LAB — CMP14 + ANION GAP
A/G RATIO: 2 (ref 1.2–2.2)
ALT: 16 IU/L (ref 0–32)
AST: 18 IU/L (ref 0–40)
Albumin: 4.6 g/dL (ref 3.5–5.5)
Alkaline Phosphatase: 48 IU/L (ref 39–117)
Anion Gap: 15 mmol/L (ref 10.0–18.0)
BILIRUBIN TOTAL: 0.3 mg/dL (ref 0.0–1.2)
BUN/Creatinine Ratio: 18 (ref 9–23)
BUN: 13 mg/dL (ref 6–24)
CALCIUM: 10 mg/dL (ref 8.7–10.2)
CO2: 26 mmol/L (ref 20–29)
Chloride: 100 mmol/L (ref 96–106)
Creatinine, Ser: 0.74 mg/dL (ref 0.57–1.00)
GFR calc Af Amer: 109 mL/min/{1.73_m2} (ref >59)
GFR calc non Af Amer: 95 mL/min/{1.73_m2} (ref >59)
Globulin, Total: 2.3 g/dL (ref 1.5–4.5)
Glucose: 87 mg/dL (ref 65–99)
Potassium: 4.5 mmol/L (ref 3.5–5.2)
Sodium: 141 mmol/L (ref 134–144)
TOTAL PROTEIN: 6.9 g/dL (ref 6.0–8.5)

## 2018-05-20 LAB — PAP IG W/ RFLX HPV ASCU

## 2018-05-20 NOTE — Telephone Encounter (Signed)
-----   Message from Princess Bruins, MD sent at 05/19/2018 11:46 AM EST ----- Regarding: Refer to Genetic Counseling [facility] Premenopausal Left Breast DCIS.  Genetic screening.

## 2018-05-20 NOTE — Telephone Encounter (Signed)
Referral to [facility] for genetic counseling, they will call to schedule.

## 2018-06-01 ENCOUNTER — Telehealth: Payer: Self-pay | Admitting: Hematology and Oncology

## 2018-06-01 NOTE — Telephone Encounter (Signed)
Unable to reach patient per 12/23 sch message - left message for patient to call back to set up genetics appts.

## 2018-06-03 NOTE — Telephone Encounter (Signed)
Patient scheduled on 06/30/18

## 2018-06-16 ENCOUNTER — Encounter: Payer: Self-pay | Admitting: Gastroenterology

## 2018-06-25 ENCOUNTER — Telehealth: Payer: Self-pay | Admitting: Hematology and Oncology

## 2018-06-25 NOTE — Telephone Encounter (Signed)
Called pt re appt being changed due to VG PAL - left vm for pt re appt changes.

## 2018-06-29 ENCOUNTER — Ambulatory Visit: Payer: 59 | Admitting: Hematology and Oncology

## 2018-06-29 NOTE — Progress Notes (Signed)
Patient Care Team: Rodriguez-Southworth, Sunday Spillers, PA-C as PCP - General (Internal Medicine) Jovita Kussmaul, MD as Consulting Physician (General Surgery) Nicholas Lose, MD as Consulting Physician (Hematology and Oncology) Thea Silversmith, MD as Consulting Physician (Radiation Oncology) Sylvan Cheese, NP as Nurse Practitioner (Hematology and Oncology)  DIAGNOSIS:    ICD-10-CM   1. Malignant neoplasm of upper-inner quadrant of left breast in female, estrogen receptor positive (Erin Bowers) C50.212 tamoxifen (NOLVADEX) 20 MG tablet   Z17.0     SUMMARY OF ONCOLOGIC HISTORY:   Breast cancer of upper-inner quadrant of left female breast (Erin Bowers)   06/25/2015 Mammogram    Screening detected left breast calcifications posterior depth 4.4 x 3.3 x 2.3 cm    06/27/2015 Initial Diagnosis    Left breast biopsy upper quadrant: DCIS with comedo necrosis and calcification, grade 2-3, ER 100%, PR 15%, Tis N0 stage 0 clinical stage    08/03/2015 Surgery    Left lumpectomy Marlou Starks): Intermediate to high-grade DCIS with comedonecrosis, DCIS close superior margin, excision left superior margin DCIS less than 0.1 cm, additional superior margin still showed DCIS focally positive, ER 100%, PR 15%    08/17/2015 Surgery    Left superior margin excision: Less than 0.2 cm residual high-grade DCIS; left additional superior/medial margin: Benign    09/26/2015 - 10/24/2015 Radiation Therapy    Adjuvant radiation therapy [name]). Left breast/ 42.72 Gy at 2.67 Gy per fraction x 16 fractions. Left breast boost/ 10 Gy at 2 Gy per fraction x 5 fractions    11/23/2015 -  Anti-estrogen oral therapy    Tamoxifen 20 mg daily 5 years     CHIEF COMPLIANT: Follow-up of tamoxifen therapy  INTERVAL HISTORY: Erin Bowers is a 53 y.o. with above-mentioned history of left breast DCIS who is currently on tamoxifen therapy. I last saw the patient 1 year ago. Her most recent mammogram from 05/19/18 showed no evidence of  malignancy bilaterally. She presents to the clinic alone today. She is tolerating Tamoxifen well and reports hot flashes that are mild and tolerable. She denies muscle aches and pains and notes occasional stabbing pain in her breast that resolves. She had a breast exam done by her gynecologist within the past 6 months. She reviewed her medication list with me.   REVIEW OF SYSTEMS:   Constitutional: Denies fevers, chills or abnormal weight loss (+) mild hot flashes Eyes: Denies blurriness of vision Ears, nose, mouth, throat, and face: Denies mucositis or sore throat Respiratory: Denies cough, dyspnea or wheezes Cardiovascular: Denies palpitation, chest discomfort Gastrointestinal:  Denies nausea, heartburn or change in bowel habits Skin: Denies abnormal skin rashes Lymphatics: Denies new lymphadenopathy or easy bruising Neurological: Denies numbness, tingling or new weaknesses Behavioral/Psych: Mood is stable, no new changes  Extremities: No lower extremity edema Breast: denies any lumps or nodules in either breasts (+) occasional stabbing pain in breast All other systems were reviewed with the patient and are negative.  I have reviewed the past medical history, past surgical history, social history and family history with the patient and they are unchanged from previous note.  ALLERGIES:  is allergic to adhesive [tape].  MEDICATIONS:  Current Outpatient Medications  Medication Sig Dispense Refill  . Ascorbic Acid (VITAMIN C) 1000 MG tablet Take 1,000 mg by mouth daily.    . Cholecalciferol (D3-1000) 1000 units capsule Take 1,000 Units by mouth daily.    . Multiple Vitamin (MULTIVITAMIN) tablet Take 1 tablet by mouth daily.    Marland Kitchen olmesartan-hydrochlorothiazide (BENICAR HCT) 20-12.5  MG tablet Reported on 10/23/2015 90 tablet 0  . Omega-3 Fatty Acids (FISH OIL) 1000 MG CAPS Take by mouth.    . tamoxifen (NOLVADEX) 20 MG tablet Take 1 tablet (20 mg total) by mouth daily. 90 tablet 3   No  current facility-administered medications for this visit.     PHYSICAL EXAMINATION: ECOG PERFORMANCE STATUS: 0 - Asymptomatic  Vitals:   06/30/18 1504  BP: (!) 135/93  Pulse: 69  Resp: 18  Temp: 98.6 F (37 C)  SpO2: 100%   Filed Weights   06/30/18 1504  Weight: 179 lb 8 oz (81.4 kg)    GENERAL: alert, no distress and comfortable SKIN: skin color, texture, turgor are normal, no rashes or significant lesions EYES: normal, Conjunctiva are pink and non-injected, sclera clear OROPHARYNX: no exudate, no erythema and lips, buccal mucosa, and tongue normal  NECK: supple, thyroid normal size, non-tender, without nodularity LYMPH: no palpable lymphadenopathy in the cervical, axillary or inguinal LUNGS: clear to auscultation and percussion with normal breathing effort HEART: regular rate & rhythm and no murmurs and no lower extremity edema ABDOMEN: abdomen soft, non-tender and normal bowel sounds MUSCULOSKELETAL: no cyanosis of digits and no clubbing  NEURO: alert & oriented x 3 with fluent speech, no focal motor/sensory deficits EXTREMITIES: No lower extremity edema  LABORATORY DATA:  I have reviewed the data as listed CMP Latest Ref Rng & Units 05/19/2018 07/04/2015  Glucose 65 - 99 mg/dL 87 76  BUN 6 - 24 mg/dL 13 15.9  Creatinine 0.57 - 1.00 mg/dL 0.74 0.8  Sodium 134 - 144 mmol/L 141 143  Potassium 3.5 - 5.2 mmol/L 4.5 3.7  Chloride 96 - 106 mmol/L 100 -  CO2 20 - 29 mmol/L 26 30(H)  Calcium 8.7 - 10.2 mg/dL 10.0 10.1  Total Protein 6.0 - 8.5 g/dL 6.9 8.0  Total Bilirubin 0.0 - 1.2 mg/dL 0.3 0.33  Alkaline Phos 39 - 117 IU/L 48 73  AST 0 - 40 IU/L 18 19  ALT 0 - 32 IU/L 16 20    Lab Results  Component Value Date   WBC 5.3 05/19/2018   HGB 12.3 05/19/2018   HCT 37.4 05/19/2018   MCV 86 05/19/2018   PLT 289 05/19/2018   NEUTROABS 2.5 07/04/2015    ASSESSMENT & PLAN:  Breast cancer of upper-inner quadrant of left female breast (Erin Bowers) Left lumpectomy 08/03/2015 :  Intermediate to high-grade DCIS with comedonecrosis, DCIS close superior margin, excision left superior margin DCIS less than 0.1 cm, additional superior margin still showed DCIS focally positive, ER 100%, PR 15% Reexcision of margins 08/17/2015: 0.2 cm residual DCIS but final margins negative Pathologic stage: Tis N0 stage 0 Adjuvant radiation therapy from 09/26/2015 to 10/24/2015  Current treatment: Tamoxifen 20 mg daily 5 years started 11/23/2015  Tamoxifen toxicities: 1. Hot flashes mild Denies any musculoskeletal aches and pains.  Breast Cancer Surveillance: 1. Breast examon 06/30/2018: Normal 2. Mammogram12/11/2019no abnormalities. Postsurgical changes. Breast Density Category B.  Return to clinic in 1 year for follow-up   No orders of the defined types were placed in this encounter.  The patient has a good understanding of the overall plan. she agrees with it. she will call with any problems that may develop before the next visit here.  Nicholas Lose, MD 06/30/2018  Julious Oka Dorshimer am acting as scribe for Dr. Nicholas Lose.  I have reviewed the above documentation for accuracy and completeness, and I agree with the above.

## 2018-06-30 ENCOUNTER — Telehealth: Payer: Self-pay | Admitting: Hematology and Oncology

## 2018-06-30 ENCOUNTER — Inpatient Hospital Stay: Payer: 59 | Attending: Hematology and Oncology | Admitting: Hematology and Oncology

## 2018-06-30 DIAGNOSIS — Z170 Estrogen receptor positive status [ER+]: Secondary | ICD-10-CM | POA: Diagnosis not present

## 2018-06-30 DIAGNOSIS — C50212 Malignant neoplasm of upper-inner quadrant of left female breast: Secondary | ICD-10-CM | POA: Diagnosis not present

## 2018-06-30 DIAGNOSIS — Z923 Personal history of irradiation: Secondary | ICD-10-CM | POA: Insufficient documentation

## 2018-06-30 DIAGNOSIS — Z79810 Long term (current) use of selective estrogen receptor modulators (SERMs): Secondary | ICD-10-CM | POA: Diagnosis not present

## 2018-06-30 DIAGNOSIS — R232 Flushing: Secondary | ICD-10-CM | POA: Diagnosis not present

## 2018-06-30 DIAGNOSIS — Z79899 Other long term (current) drug therapy: Secondary | ICD-10-CM | POA: Diagnosis not present

## 2018-06-30 MED ORDER — TAMOXIFEN CITRATE 20 MG PO TABS: 20.0000 mg | ORAL_TABLET | Freq: Every day | ORAL | 3 refills | Status: DC

## 2018-06-30 NOTE — Telephone Encounter (Signed)
Gave avs and calendar ° °

## 2018-06-30 NOTE — Assessment & Plan Note (Signed)
Left lumpectomy 08/03/2015 : Intermediate to high-grade DCIS with comedonecrosis, DCIS close superior margin, excision left superior margin DCIS less than 0.1 cm, additional superior margin still showed DCIS focally positive, ER 100%, PR 15% Reexcision of margins 08/17/2015: 0.2 cm residual DCIS but final margins negative Pathologic stage: Tis N0 stage 0 Adjuvant radiation therapy from 09/26/2015 to 10/24/2015  Current treatment: Tamoxifen 20 mg daily 5 years started 11/23/2015  Tamoxifen toxicities: 1. Hot flashes moderate degree Denies any musculoskeletal aches and pains.  Breast Cancer Surveillance: 1. Breast examon 06/30/2018: Normal 2. Mammogram12/11/2019no abnormalities. Postsurgical changes. Breast Density Category B.  Return to clinic in 1 year for follow-up

## 2018-07-13 ENCOUNTER — Ambulatory Visit (AMBULATORY_SURGERY_CENTER): Payer: Self-pay | Admitting: *Deleted

## 2018-07-13 VITALS — Ht 64.0 in | Wt 184.0 lb

## 2018-07-13 DIAGNOSIS — Z1211 Encounter for screening for malignant neoplasm of colon: Secondary | ICD-10-CM

## 2018-07-13 NOTE — Progress Notes (Signed)
Patient came in today for her PV before colonoscopy. I explained to her our policy that a care-partner is required to stay in the waiting area the entire time and drive pt home she states she does not have any one here to do that. Pt request to cancel the procedure and pv today until she can find someone to be with her. Handouts given to pt about the appointment mate and [place] care. Patient will call us back when she wants to reschedule the procedure and pre-visit. Colon and pv cancelled at this time.

## 2018-07-27 ENCOUNTER — Ambulatory Visit (AMBULATORY_SURGERY_CENTER): Payer: Self-pay

## 2018-07-27 ENCOUNTER — Other Ambulatory Visit: Payer: Self-pay

## 2018-07-27 ENCOUNTER — Encounter: Payer: 59 | Admitting: Gastroenterology

## 2018-07-27 VITALS — Ht 64.0 in | Wt 185.0 lb

## 2018-07-27 DIAGNOSIS — Z1211 Encounter for screening for malignant neoplasm of colon: Secondary | ICD-10-CM

## 2018-07-27 MED ORDER — NA SULFATE-K SULFATE-MG SULF 17.5-3.13-1.6 GM/177ML PO SOLN: 1.0000 | Freq: Once | ORAL | 0 refills | Status: AC

## 2018-07-27 MED FILL — SUPREP BOWEL PREP KIT: 17.5-3.13-1 | 1 days supply | Qty: 354 | Fill #0

## 2018-07-27 NOTE — Progress Notes (Signed)
No egg or soy allergy known to patient  No issues with past sedation with any surgeries  or procedures, no intubation problems  No diet pills per patient No home 02 use per patient  No blood thinners per patient  Pt denies issues with constipation  No A fib or A flutter  EMMI video sent to pt's e mail  

## 2018-08-02 ENCOUNTER — Encounter: Payer: Self-pay | Admitting: Gastroenterology

## 2018-08-10 MED FILL — TAMOXIFEN CITRATE 20 MG TAB: 20 | 90 days supply | Qty: 90 | Fill #0

## 2018-08-16 ENCOUNTER — Encounter: Payer: Self-pay | Admitting: Gastroenterology

## 2018-08-16 ENCOUNTER — Other Ambulatory Visit: Payer: Self-pay

## 2018-08-16 ENCOUNTER — Ambulatory Visit (AMBULATORY_SURGERY_CENTER): Payer: 59 | Admitting: Gastroenterology

## 2018-08-16 VITALS — BP 143/80 | HR 76 | Temp 97.1°F | Resp 13 | Ht 64.0 in | Wt 185.0 lb

## 2018-08-16 DIAGNOSIS — D129 Benign neoplasm of anus and anal canal: Secondary | ICD-10-CM

## 2018-08-16 DIAGNOSIS — Z1211 Encounter for screening for malignant neoplasm of colon: Secondary | ICD-10-CM

## 2018-08-16 DIAGNOSIS — K621 Rectal polyp: Secondary | ICD-10-CM | POA: Diagnosis not present

## 2018-08-16 DIAGNOSIS — D128 Benign neoplasm of rectum: Secondary | ICD-10-CM

## 2018-08-16 DIAGNOSIS — D127 Benign neoplasm of rectosigmoid junction: Secondary | ICD-10-CM

## 2018-08-16 DIAGNOSIS — K635 Polyp of colon: Secondary | ICD-10-CM | POA: Diagnosis not present

## 2018-08-16 MED ORDER — SODIUM CHLORIDE 0.9 % IV SOLN: 500.0000 mL | Freq: Once | INTRAVENOUS | Status: DC

## 2018-08-16 NOTE — Progress Notes (Signed)
Called to room to assist during endoscopic procedure.  Patient ID and intended procedure confirmed with present staff. Received instructions for my participation in the procedure from the performing physician.  

## 2018-08-16 NOTE — Patient Instructions (Signed)
Handouts Provided:  Polyps  YOU HAD AN ENDOSCOPIC PROCEDURE TODAY AT THE [facility]:   Refer to the procedure report that was given to you for any specific questions about what was found during the examination.  If the procedure report does not answer your questions, please call your gastroenterologist to clarify.  If you requested that your care partner not be given the details of your procedure findings, then the procedure report has been included in a sealed envelope for you to review at your convenience later.  YOU SHOULD EXPECT: Some feelings of bloating in the abdomen. Passage of more gas than usual.  Walking can help get rid of the air that was put into your GI tract during the procedure and reduce the bloating. If you had a lower endoscopy (such as a colonoscopy or flexible sigmoidoscopy) you may notice spotting of blood in your stool or on the toilet paper. If you underwent a bowel prep for your procedure, you may not have a normal bowel movement for a few days.  Please Note:  You might notice some irritation and congestion in your nose or some drainage.  This is from the oxygen used during your procedure.  There is no need for concern and it should clear up in a day or so.  SYMPTOMS TO REPORT IMMEDIATELY:   Following lower endoscopy (colonoscopy or flexible sigmoidoscopy):  Excessive amounts of blood in the stool  Significant tenderness or worsening of abdominal pains  Swelling of the abdomen that is new, acute  Fever of 100F or higher  For urgent or emergent issues, a gastroenterologist can be reached at any hour by calling [phone].   DIET:  We do recommend a small meal at first, but then you may proceed to your regular diet.  Drink plenty of fluids but you should avoid alcoholic beverages for 24 hours.  ACTIVITY:  You should plan to take it easy for the rest of today and you should NOT DRIVE or use heavy machinery until tomorrow (because of the sedation  medicines used during the test).    FOLLOW UP: Our staff will call the number listed on your records the next business day following your procedure to check on you and address any questions or concerns that you may have regarding the information given to you following your procedure. If we do not reach you, we will leave a message.  However, if you are feeling well and you are not experiencing any problems, there is no need to return our call.  We will assume that you have returned to your regular daily activities without incident.  If any biopsies were taken you will be contacted by phone or by letter within the next 1-3 weeks.  Please call us at [phone] if you have not heard about the biopsies in 3 weeks.    SIGNATURES/CONFIDENTIALITY: You and/or your care partner have signed paperwork which will be entered into your electronic medical record.  These signatures attest to the fact that that the information above on your After Visit Summary has been reviewed and is understood.  Full responsibility of the confidentiality of this discharge information lies with you and/or your care-partner.  

## 2018-08-16 NOTE — Op Note (Signed)
[place] Patient Name: Erin Bowers Procedure Date: 08/16/2018 2:24 PM MRN: [id number] Endoscopist: Remo Lipps P. Havery Moros , MD Age: 53 Referring MD:  Date of Birth: 08/12/1965 Gender: Female Account #: [id number] Procedure:                Colonoscopy Indications:              Screening for colorectal malignant neoplasm, This                            is the patient's first colonoscopy Medicines:                Monitored Anesthesia Care Procedure:                Pre-Anesthesia Assessment:                           - Prior to the procedure, a History and Physical                            was performed, and patient medications and                            allergies were reviewed. The patient's tolerance of                            previous anesthesia was also reviewed. The risks                            and benefits of the procedure and the sedation                            options and risks were discussed with the patient.                            All questions were answered, and informed consent                            was obtained. Prior Anticoagulants: The patient has                            taken no previous anticoagulant or antiplatelet                            agents. ASA Grade Assessment: II - A patient with                            mild systemic disease. After reviewing the risks                            and benefits, the patient was deemed in                            satisfactory condition to undergo the procedure.  After obtaining informed consent, the colonoscope                            was passed under direct vision. Throughout the                            procedure, the patient's blood pressure, pulse, and                            oxygen saturations were monitored continuously. The                            Colonoscope was introduced through the anus and                            advanced to the the  cecum, identified by                            appendiceal orifice and ileocecal valve. The                            colonoscopy was performed without difficulty. The                            patient tolerated the procedure well. The quality                            of the bowel preparation was good. The ileocecal                            valve, appendiceal orifice, and rectum were                            photographed. Scope In: 2:32:04 PM Scope Out: 2:52:45 PM Scope Withdrawal Time: 0 hours 14 minutes 54 seconds  Total Procedure Duration: 0 hours 20 minutes 41 seconds  Findings:                 The perianal and digital rectal examinations were                            normal.                           A 3 mm polyp was found in the recto-sigmoid colon.                            The polyp was sessile. The polyp was removed with a                            cold snare. Resection and retrieval were complete.                           A diminutive polyp was found in the rectum. The  polyp was sessile. The polyp was removed with a                            cold snare. Resection and retrieval were complete.                           The sigmoid colon had some restricted mobility, and                            took some time to slowly traverse it. The exam was                            otherwise normal throughout the examined colon. Complications:            No immediate complications. Estimated blood loss:                            Minimal. Estimated Blood Loss:     Estimated blood loss was minimal. Impression:               - One 3 mm polyp at the recto-sigmoid colon,                            removed with a cold snare. Resected and retrieved.                           - One diminutive polyp in the rectum, removed with                            a cold snare. Resected and retrieved.                           - Restricted mobility of the sigmoid  colon                           - Normal exam otherwise Recommendation:           - Patient has a contact number available for                            emergencies. The signs and symptoms of potential                            delayed complications were discussed with the                            patient. Return to normal activities tomorrow.                            Written discharge instructions were provided to the                            patient.                           -  Resume previous diet.                           - Continue present medications.                           - Await pathology results. Remo Lipps P. Armbruster, MD 08/16/2018 2:56:30 PM This report has been signed electronically.

## 2018-08-16 NOTE — Progress Notes (Signed)
Pt's states no medical or surgical changes since previsit or office visit. 

## 2018-08-16 NOTE — Progress Notes (Signed)
A and O x3. Report to RN. Tolerated MAC anesthesia well.

## 2018-08-17 ENCOUNTER — Telehealth: Payer: Self-pay | Admitting: *Deleted

## 2018-08-17 NOTE — Telephone Encounter (Signed)
Did not reach pt on 2nd f/u call.Did not leave message due to permission to leave message on f/u call was not addressed or documented .

## 2018-08-17 NOTE — Telephone Encounter (Signed)
First attempt, left VM.  

## 2018-08-24 ENCOUNTER — Ambulatory Visit: Payer: 59 | Admitting: Internal Medicine

## 2018-08-24 ENCOUNTER — Encounter: Payer: Self-pay | Admitting: Internal Medicine

## 2018-08-24 ENCOUNTER — Other Ambulatory Visit: Payer: Self-pay

## 2018-08-24 VITALS — BP 130/74 | HR 80 | Temp 98.6°F | Ht 64.0 in | Wt 182.6 lb

## 2018-08-24 DIAGNOSIS — R7309 Other abnormal glucose: Secondary | ICD-10-CM

## 2018-08-24 DIAGNOSIS — F419 Anxiety disorder, unspecified: Secondary | ICD-10-CM

## 2018-08-24 DIAGNOSIS — I10 Essential (primary) hypertension: Secondary | ICD-10-CM | POA: Diagnosis not present

## 2018-08-24 MED ORDER — OLMESARTAN MEDOXOMIL-HCTZ 20-12.5 MG PO TABS: ORAL_TABLET | ORAL | 0 refills | Status: DC

## 2018-08-24 NOTE — Progress Notes (Signed)
Subjective:     Patient ID: Erin Bowers , female    DOB: 27-Apr-1966 , 53 y.o.   MRN: [id number]   Chief Complaint  Patient presents with  . Hypertension    HPI  Pt is here for Fu HTN. Admits she has not been good about avoiding sweets and carbs. Has no complaints. She has only checked her BP once since seen last time and was 120/70    Past Medical History:  Diagnosis Date  . Anxiety   . Blood transfusion without reported diagnosis     15 years ago  . Breast cancer ([place])   . Heart murmur   . Hypertension   . Personal history of radiation therapy 2017     Family History  Problem Relation Age of Onset  . Stroke Mother   . Hypertension Mother   . Stroke Father   . Colon cancer Neg Hx   . Esophageal cancer Neg Hx   . Rectal cancer Neg Hx   . Stomach cancer Neg Hx      Current Outpatient Medications:  .  Ascorbic Acid (VITAMIN C) 1000 MG tablet, Take 1,000 mg by mouth daily., Disp: , Rfl:  .  Cholecalciferol (D3-1000) 1000 units capsule, Take 1,000 Units by mouth daily., Disp: , Rfl:  .  Multiple Vitamin (MULTIVITAMIN) tablet, Take 1 tablet by mouth daily., Disp: , Rfl:  .  olmesartan-hydrochlorothiazide (BENICAR HCT) 20-12.5 MG tablet, Reported on 10/23/2015, Disp: 90 tablet, Rfl: 0 .  Omega-3 Fatty Acids (FISH OIL) 1000 MG CAPS, Take by mouth., Disp: , Rfl:  .  tamoxifen (NOLVADEX) 20 MG tablet, Take 1 tablet (20 mg total) by mouth daily., Disp: 90 tablet, Rfl: 3   Allergies  Allergen Reactions  . Adhesive [Tape] Other (See Comments)    Steri-strips cause blisters      Review of Systems  Constitutional: Negative for diaphoresis and fever.  HENT: Negative for congestion.   Respiratory: Negative for cough, chest tightness and shortness of breath.   Cardiovascular: Negative for chest pain, palpitations and leg swelling.  Endocrine: Negative for polydipsia and polyphagia.  Musculoskeletal: Negative for gait problem.  Skin: Negative for rash.  Neurological:  Negative for headaches.     Today's Vitals   08/24/18 1621  BP: 130/74  Pulse: 80  Temp: 98.6 F (37 C)  TempSrc: Oral  SpO2: 98%  Weight: 182 lb 9.6 oz (82.8 kg)  Height: 5\' 4"  (1.626 m)   Body mass index is 31.34 kg/m.   Objective:  Physical Exam   Constitutional: She is oriented to person, place, and time. She appears well-developed and well-nourished. No distress.  HENT:  Head: Normocephalic and atraumatic.  Right Ear: External ear normal.  Left Ear: External ear normal.  Nose: Nose normal.  Eyes: Conjunctivae are normal. Right eye exhibits no discharge. Left eye exhibits no discharge. No scleral icterus.  Neck: Neck supple. No thyromegaly present.  No carotid bruits bilaterally  Cardiovascular: Normal rate and regular rhythm.  No murmur heard. Pulmonary/Chest: Effort normal and breath sounds normal. No respiratory distress.  Musculoskeletal: Normal range of motion. She exhibits no edema.  Lymphadenopathy:    She has no cervical adenopathy.  Neurological: She is alert and oriented to person, place, and time.  Skin: Skin is warm and dry. Capillary refill takes less than 2 seconds. No rash noted. She is not diaphoretic.  Psychiatric: She has a normal mood and affect. Her behavior is normal. Judgment and thought content normal.  Nursing  note reviewed.     Assessment And Plan:    1. Essential hypertension- stable. May continue same med. I refilled it and FU in 3 months.  - Lipid Profile - CMP14 + Anion Gap - CBC no Diff   2. Abnormal glucose- unknown status.  - Hemoglobin A1c    Tavaughn Silguero RODRIGUEZ-SOUTHWORTH, PA-C

## 2018-08-25 ENCOUNTER — Other Ambulatory Visit: Payer: Self-pay

## 2018-08-25 LAB — CMP14 + ANION GAP
ALT: 19 IU/L (ref 0–32)
ANION GAP: 16 mmol/L (ref 10.0–18.0)
AST: 20 IU/L (ref 0–40)
Albumin/Globulin Ratio: 1.9 (ref 1.2–2.2)
Albumin: 4.6 g/dL (ref 3.8–4.8)
Alkaline Phosphatase: 49 IU/L (ref 39–117)
BUN/Creatinine Ratio: 20 (ref 9–23)
BUN: 16 mg/dL (ref 6–24)
Bilirubin Total: <0.2 mg/dL (ref 0.0–1.2)
CO2: 21 mmol/L (ref 20–29)
Calcium: 9.7 mg/dL (ref 8.7–10.2)
Chloride: 105 mmol/L (ref 96–106)
Creatinine, Ser: 0.81 mg/dL (ref 0.57–1.00)
GFR calc Af Amer: 98 mL/min/{1.73_m2} (ref >59)
GFR calc non Af Amer: 85 mL/min/{1.73_m2} (ref >59)
GLOBULIN, TOTAL: 2.4 g/dL (ref 1.5–4.5)
Glucose: 86 mg/dL (ref 65–99)
Potassium: 4 mmol/L (ref 3.5–5.2)
Sodium: 142 mmol/L (ref 134–144)
Total Protein: 7 g/dL (ref 6.0–8.5)

## 2018-08-25 LAB — LIPID PANEL
CHOLESTEROL TOTAL: 214 mg/dL — AB (ref 100–199)
Chol/HDL Ratio: 3.1 ratio (ref 0.0–4.4)
HDL: 68 mg/dL (ref >39)
LDL CALC: 130 mg/dL — AB (ref 0–99)
TRIGLYCERIDES: 79 mg/dL (ref 0–149)
VLDL Cholesterol Cal: 16 mg/dL (ref 5–40)

## 2018-08-25 LAB — HEMOGLOBIN A1C
Est. average glucose Bld gHb Est-mCnc: 117 mg/dL
Hgb A1c MFr Bld: 5.7 % — ABNORMAL HIGH (ref 4.8–5.6)

## 2018-08-25 LAB — CBC
Hematocrit: 34 % (ref 34.0–46.6)
Hemoglobin: 11.8 g/dL (ref 11.1–15.9)
MCH: 28.1 pg (ref 26.6–33.0)
MCHC: 34.7 g/dL (ref 31.5–35.7)
MCV: 81 fL (ref 79–97)
Platelets: 300 10*3/uL (ref 150–450)
RBC: 4.2 x10E6/uL (ref 3.77–5.28)
RDW: 13.2 % (ref 11.7–15.4)
WBC: 6.3 10*3/uL (ref 3.4–10.8)

## 2018-08-25 MED ORDER — OLMESARTAN MEDOXOMIL-HCTZ 20-12.5 MG PO TABS: ORAL_TABLET | ORAL | 0 refills | Status: DC

## 2018-08-25 MED FILL — OLMESARTAN-HCTZ 20-12.5 MG: 20-12.5 | 90 days supply | Qty: 90 | Fill #0

## 2018-10-15 DIAGNOSIS — M9902 Segmental and somatic dysfunction of thoracic region: Secondary | ICD-10-CM | POA: Diagnosis not present

## 2018-10-15 DIAGNOSIS — M9903 Segmental and somatic dysfunction of lumbar region: Secondary | ICD-10-CM | POA: Diagnosis not present

## 2018-10-15 DIAGNOSIS — M9901 Segmental and somatic dysfunction of cervical region: Secondary | ICD-10-CM | POA: Diagnosis not present

## 2018-10-15 DIAGNOSIS — R293 Abnormal posture: Secondary | ICD-10-CM | POA: Diagnosis not present

## 2018-10-18 DIAGNOSIS — R293 Abnormal posture: Secondary | ICD-10-CM | POA: Diagnosis not present

## 2018-10-18 DIAGNOSIS — M9902 Segmental and somatic dysfunction of thoracic region: Secondary | ICD-10-CM | POA: Diagnosis not present

## 2018-10-18 DIAGNOSIS — M9901 Segmental and somatic dysfunction of cervical region: Secondary | ICD-10-CM | POA: Diagnosis not present

## 2018-10-18 DIAGNOSIS — M9903 Segmental and somatic dysfunction of lumbar region: Secondary | ICD-10-CM | POA: Diagnosis not present

## 2018-10-20 DIAGNOSIS — M9903 Segmental and somatic dysfunction of lumbar region: Secondary | ICD-10-CM | POA: Diagnosis not present

## 2018-10-20 DIAGNOSIS — R293 Abnormal posture: Secondary | ICD-10-CM | POA: Diagnosis not present

## 2018-10-20 DIAGNOSIS — M9902 Segmental and somatic dysfunction of thoracic region: Secondary | ICD-10-CM | POA: Diagnosis not present

## 2018-10-20 DIAGNOSIS — M9901 Segmental and somatic dysfunction of cervical region: Secondary | ICD-10-CM | POA: Diagnosis not present

## 2018-10-27 DIAGNOSIS — M9901 Segmental and somatic dysfunction of cervical region: Secondary | ICD-10-CM | POA: Diagnosis not present

## 2018-10-27 DIAGNOSIS — M9902 Segmental and somatic dysfunction of thoracic region: Secondary | ICD-10-CM | POA: Diagnosis not present

## 2018-10-27 DIAGNOSIS — M9903 Segmental and somatic dysfunction of lumbar region: Secondary | ICD-10-CM | POA: Diagnosis not present

## 2018-10-27 DIAGNOSIS — R293 Abnormal posture: Secondary | ICD-10-CM | POA: Diagnosis not present

## 2018-11-03 DIAGNOSIS — M9901 Segmental and somatic dysfunction of cervical region: Secondary | ICD-10-CM | POA: Diagnosis not present

## 2018-11-03 DIAGNOSIS — R293 Abnormal posture: Secondary | ICD-10-CM | POA: Diagnosis not present

## 2018-11-03 DIAGNOSIS — M9903 Segmental and somatic dysfunction of lumbar region: Secondary | ICD-10-CM | POA: Diagnosis not present

## 2018-11-03 DIAGNOSIS — M9902 Segmental and somatic dysfunction of thoracic region: Secondary | ICD-10-CM | POA: Diagnosis not present

## 2018-11-04 DIAGNOSIS — M9903 Segmental and somatic dysfunction of lumbar region: Secondary | ICD-10-CM | POA: Diagnosis not present

## 2018-11-04 DIAGNOSIS — R293 Abnormal posture: Secondary | ICD-10-CM | POA: Diagnosis not present

## 2018-11-04 DIAGNOSIS — M9902 Segmental and somatic dysfunction of thoracic region: Secondary | ICD-10-CM | POA: Diagnosis not present

## 2018-11-04 DIAGNOSIS — M9901 Segmental and somatic dysfunction of cervical region: Secondary | ICD-10-CM | POA: Diagnosis not present

## 2018-11-08 MED FILL — TAMOXIFEN CITRATE 20 MG TAB: 20 | 90 days supply | Qty: 90 | Fill #1

## 2018-11-11 DIAGNOSIS — M9902 Segmental and somatic dysfunction of thoracic region: Secondary | ICD-10-CM | POA: Diagnosis not present

## 2018-11-11 DIAGNOSIS — R293 Abnormal posture: Secondary | ICD-10-CM | POA: Diagnosis not present

## 2018-11-11 DIAGNOSIS — M9901 Segmental and somatic dysfunction of cervical region: Secondary | ICD-10-CM | POA: Diagnosis not present

## 2018-11-11 DIAGNOSIS — M9903 Segmental and somatic dysfunction of lumbar region: Secondary | ICD-10-CM | POA: Diagnosis not present

## 2018-11-16 DIAGNOSIS — M9901 Segmental and somatic dysfunction of cervical region: Secondary | ICD-10-CM | POA: Diagnosis not present

## 2018-11-16 DIAGNOSIS — R293 Abnormal posture: Secondary | ICD-10-CM | POA: Diagnosis not present

## 2018-11-16 DIAGNOSIS — M9903 Segmental and somatic dysfunction of lumbar region: Secondary | ICD-10-CM | POA: Diagnosis not present

## 2018-11-16 DIAGNOSIS — M9902 Segmental and somatic dysfunction of thoracic region: Secondary | ICD-10-CM | POA: Diagnosis not present

## 2018-11-19 DIAGNOSIS — R293 Abnormal posture: Secondary | ICD-10-CM | POA: Diagnosis not present

## 2018-11-19 DIAGNOSIS — M9903 Segmental and somatic dysfunction of lumbar region: Secondary | ICD-10-CM | POA: Diagnosis not present

## 2018-11-19 DIAGNOSIS — M9901 Segmental and somatic dysfunction of cervical region: Secondary | ICD-10-CM | POA: Diagnosis not present

## 2018-11-19 DIAGNOSIS — M9902 Segmental and somatic dysfunction of thoracic region: Secondary | ICD-10-CM | POA: Diagnosis not present

## 2018-12-09 ENCOUNTER — Other Ambulatory Visit: Payer: Self-pay | Admitting: Internal Medicine

## 2018-12-09 ENCOUNTER — Encounter: Payer: Self-pay | Admitting: Internal Medicine

## 2018-12-09 MED FILL — OLMESARTAN-HCTZ 20-12.5 MG: 20-12.5 | 90 days supply | Qty: 90 | Fill #0

## 2019-01-06 ENCOUNTER — Ambulatory Visit: Payer: 59 | Admitting: Internal Medicine

## 2019-01-06 ENCOUNTER — Encounter: Payer: Self-pay | Admitting: Internal Medicine

## 2019-01-06 ENCOUNTER — Other Ambulatory Visit: Payer: Self-pay

## 2019-01-06 VITALS — BP 132/80 | HR 67 | Temp 98.3°F | Ht 63.2 in | Wt 184.2 lb

## 2019-01-06 DIAGNOSIS — I10 Essential (primary) hypertension: Secondary | ICD-10-CM

## 2019-01-06 DIAGNOSIS — Z114 Encounter for screening for human immunodeficiency virus [HIV]: Secondary | ICD-10-CM

## 2019-01-06 DIAGNOSIS — E782 Mixed hyperlipidemia: Secondary | ICD-10-CM

## 2019-01-06 DIAGNOSIS — E669 Obesity, unspecified: Secondary | ICD-10-CM

## 2019-01-06 DIAGNOSIS — R7309 Other abnormal glucose: Secondary | ICD-10-CM | POA: Diagnosis not present

## 2019-01-06 DIAGNOSIS — Z0000 Encounter for general adult medical examination without abnormal findings: Secondary | ICD-10-CM

## 2019-01-06 DIAGNOSIS — Z23 Encounter for immunization: Secondary | ICD-10-CM | POA: Diagnosis not present

## 2019-01-06 DIAGNOSIS — E7800 Pure hypercholesterolemia, unspecified: Secondary | ICD-10-CM | POA: Diagnosis not present

## 2019-01-06 LAB — POCT URINALYSIS DIPSTICK
Bilirubin, UA: NEGATIVE
Blood, UA: NEGATIVE
Glucose, UA: NEGATIVE
Ketones, UA: NEGATIVE
Leukocytes, UA: NEGATIVE
Nitrite, UA: NEGATIVE
Protein, UA: NEGATIVE
Spec Grav, UA: 1.02 (ref 1.010–1.025)
Urobilinogen, UA: 0.2 E.U./dL
pH, UA: 5.5 (ref 5.0–8.0)

## 2019-01-06 LAB — POCT UA - MICROALBUMIN
Albumin/Creatinine Ratio, Urine, POC: <30
Creatinine, POC: 50 mg/dL
Microalbumin Ur, POC: 10 mg/L

## 2019-01-06 MED ORDER — OLMESARTAN MEDOXOMIL-HCTZ 20-12.5 MG PO TABS: ORAL_TABLET | ORAL | 0 refills | Status: DC

## 2019-01-06 NOTE — Patient Instructions (Addendum)
Your EKG was normal today. Please work on weight reduction with the goal to get your BMI less than 30 or less.   Preventive Care 35-51 Years Old, Female Preventive care refers to visits with your health care provider and lifestyle choices that can promote health and wellness. This includes:  A yearly physical exam. This may also be called an annual well check.  Regular dental visits and eye exams.  Immunizations.  Screening for certain conditions.  Healthy lifestyle choices, such as eating a healthy diet, getting regular exercise, not using drugs or products that contain nicotine and tobacco, and limiting alcohol use. What can I expect for my preventive care visit? Physical exam Your health care provider will check your:  Height and weight. This may be used to calculate body mass index (BMI), which tells if you are at a healthy weight.  Heart rate and blood pressure.  Skin for abnormal spots. Counseling Your health care provider may ask you questions about your:  Alcohol, tobacco, and drug use.  Emotional well-being.  Home and relationship well-being.  Sexual activity.  Eating habits.  Work and work Statistician.  Method of birth control.  Menstrual cycle.  Pregnancy history. What immunizations do I need?  Influenza (flu) vaccine  This is recommended every year. Tetanus, diphtheria, and pertussis (Tdap) vaccine  You may need a Td booster every 10 years. Varicella (chickenpox) vaccine  You may need this if you have not been vaccinated. Zoster (shingles) vaccine  You may need this after age 47. Measles, mumps, and rubella (MMR) vaccine  You may need at least one dose of MMR if you were born in 1957 or later. You may also need a second dose. Pneumococcal conjugate (PCV13) vaccine  You may need this if you have certain conditions and were not previously vaccinated. Pneumococcal polysaccharide (PPSV23) vaccine  You may need one or two doses if you smoke  cigarettes or if you have certain conditions. Meningococcal conjugate (MenACWY) vaccine  You may need this if you have certain conditions. Hepatitis A vaccine  You may need this if you have certain conditions or if you travel or work in places where you may be exposed to hepatitis A. Hepatitis B vaccine  You may need this if you have certain conditions or if you travel or work in places where you may be exposed to hepatitis B. Haemophilus influenzae type b (Hib) vaccine  You may need this if you have certain conditions. Human papillomavirus (HPV) vaccine  If recommended by your health care provider, you may need three doses over 6 months. You may receive vaccines as individual doses or as more than one vaccine together in one shot (combination vaccines). Talk with your health care provider about the risks and benefits of combination vaccines. What tests do I need? Blood tests  Lipid and cholesterol levels. These may be checked every 5 years, or more frequently if you are over 29 years old.  Hepatitis C test.  Hepatitis B test. Screening  Lung cancer screening. You may have this screening every year starting at age 42 if you have a 30-pack-year history of smoking and currently smoke or have quit within the past 15 years.  Colorectal cancer screening. All adults should have this screening starting at age 68 and continuing until age 27. Your health care provider may recommend screening at age 44 if you are at increased risk. You will have tests every 1-10 years, depending on your results and the type of screening test.  Diabetes screening. This is done by checking your blood sugar (glucose) after you have not eaten for a while (fasting). You may have this done every 1-3 years.  Mammogram. This may be done every 1-2 years. Talk with your health care provider about when you should start having regular mammograms. This may depend on whether you have a family history of breast cancer.   BRCA-related cancer screening. This may be done if you have a family history of breast, ovarian, tubal, or peritoneal cancers.  Pelvic exam and Pap test. This may be done every 3 years starting at age 32. Starting at age 57, this may be done every 5 years if you have a Pap test in combination with an HPV test. Other tests  Sexually transmitted disease (STD) testing.  Bone density scan. This is done to screen for osteoporosis. You may have this scan if you are at high risk for osteoporosis. Follow these instructions at home: Eating and drinking  Eat a diet that includes fresh fruits and vegetables, whole grains, lean protein, and low-fat dairy.  Take vitamin and mineral supplements as recommended by your health care provider.  Do not drink alcohol if: ? Your health care provider tells you not to drink. ? You are pregnant, may be pregnant, or are planning to become pregnant.  If you drink alcohol: ? Limit how much you have to 0-1 drink a day. ? Be aware of how much alcohol is in your drink. In the [place] one drink equals one 12 oz bottle of beer (355 mL), one 5 oz glass of wine (148 mL), or one 1 oz glass of hard liquor (44 mL). Lifestyle  Take daily care of your teeth and gums.  Stay active. Exercise for at least 30 minutes on 5 or more days each week.  Do not use any products that contain nicotine or tobacco, such as cigarettes, e-cigarettes, and chewing tobacco. If you need help quitting, ask your health care provider.  If you are sexually active, practice safe sex. Use a condom or other form of birth control (contraception) in order to prevent pregnancy and STIs (sexually transmitted infections).  If told by your health care provider, take low-dose aspirin daily starting at age 31. What's next?  Visit your health care provider once a year for a well check visit.  Ask your health care provider how often you should have your eyes and teeth checked.  Stay up to date on all  vaccines. This information is not intended to replace advice given to you by your health care provider. Make sure you discuss any questions you have with your health care provider. Document Released: 06/22/2015 Document Revised: 02/04/2018 Document Reviewed: 02/04/2018 Elsevier Patient Education  2020 [employer].

## 2019-01-06 NOTE — Progress Notes (Addendum)
Subjective:     Patient ID: Erin Bowers , female    DOB: 1965-07-17 , 53 y.o.   MRN: [id number]   Chief Complaint  Patient presents with  . Annual Exam    HPI Pt is here for yearly physical. Denies any complaints. She is up to date on her pap, mammogram and colonoscopy. She does not recall when her last TD was. She has not been going out to exercise since Covid and has not exercised at home.    Past Medical History:  Diagnosis Date  . Blood transfusion without reported diagnosis     15 years ago  . Breast cancer ([place])   . Heart murmur   . Hypertension   . Personal history of radiation therapy 2017     Family History  Problem Relation Age of Onset  . Stroke Mother   . Hypertension Mother   . Stroke Father   . Colon cancer Neg Hx   . Esophageal cancer Neg Hx   . Rectal cancer Neg Hx   . Stomach cancer Neg Hx      Current Outpatient Medications:  .  Ascorbic Acid (VITAMIN C) 1000 MG tablet, Take 1,000 mg by mouth daily., Disp: , Rfl:  .  Cholecalciferol (D3-1000) 1000 units capsule, Take 1,000 Units by mouth daily., Disp: , Rfl:  .  Multiple Vitamin (MULTIVITAMIN) tablet, Take 1 tablet by mouth daily., Disp: , Rfl:  .  olmesartan-hydrochlorothiazide (BENICAR HCT) 20-12.5 MG tablet, TAKE 1 TABLET BY MOUTH DAILY, Disp: 90 tablet, Rfl: 0 .  Omega-3 Fatty Acids (FISH OIL) 1000 MG CAPS, Take by mouth., Disp: , Rfl:  .  tamoxifen (NOLVADEX) 20 MG tablet, Take 1 tablet (20 mg total) by mouth daily., Disp: 90 tablet, Rfl: 3   Allergies  Allergen Reactions  . Adhesive [Tape] Other (See Comments)    Steri-strips cause blisters      Review of Systems  + for weight gain, the rest is neg.   Today's Vitals   01/06/19 0944  BP: 132/80  Pulse: 67  Temp: 98.3 F (36.8 C)  TempSrc: Oral  SpO2: 98%  Weight: 184 lb 3.2 oz (83.6 kg)  Height: 5' 3.2" (1.605 m)   Body mass index is 32.42 kg/m.   Objective:  Physical Exam  BP 132/80 (BP Location: Left Arm, Patient  Position: Sitting, Cuff Size: Normal)   Pulse 67   Temp 98.3 F (36.8 C) (Oral)   Ht 5' 3.2" (1.605 m)   Wt 184 lb 3.2 oz (83.6 kg)   SpO2 98%   BMI 32.42 kg/m   General Appearance:    Alert, cooperative, no distress, appears stated age  Head:    Normocephalic, without obvious abnormality, atraumatic  Eyes:    PERRL, conjunctiva/corneas clear, EOM's intact of both eyes  Ears:    Normal TM's and external ear canals, both ears  Nose:   Nares normal, septum midline, mucosa normal, no drainage    or sinus tenderness  Throat:   Lips, mucosa, and tongue normal; teeth and gums normal  Neck:   Supple, symmetrical, trachea midline, no adenopathy;    thyroid:  no enlargement/tenderness/nodules.  Back:     Symmetric, no curvature, ROM normal, no CVA tenderness  Lungs:     Clear to auscultation bilaterally, respirations unlabored  Chest Wall:    No tenderness or deformity   Heart:    Regular rate and rhythm, S1 and S2 normal, no murmur, rub   or gallop  Breast Exam:    Has a well healed scar on L upper breast.   No tenderness, masses, or nipple abnormality  Abdomen:     Soft, non-tender, bowel sounds active all four quadrants,    no masses, no organomegaly        Extremities:   Extremities normal, atraumatic, no cyanosis or edema  Pulses:   2+ and symmetric all extremities  Skin:   Skin color, texture, turgor normal, no rashes or lesions  Lymph nodes:   Cervical, supraclavicular, and axillary nodes normal  Neurologic:   CNII-XII intact, normal strength, sensation and reflexes    throughout  EKG- normal.  UA- negative.  Assessment And Plan:    1. Essential hypertension- stable. May continue same meds and FU in 3 months.  - CMP14 + Anion Gap - CBC no Diff  2. Abnormal glucose- unknown status     HGBA1C ordered  - olmesartan-hydrochlorothiazide (BENICAR HCT) 20-12.5 MG tablet; TAKE 1 TABLET BY MOUTH DAILY  Dispense: 90 tablet; Refill: 0  3. Encounter for screening for HIV- screen -  HIV antibody (with reflex)  4. Obesity (BMI 30.0-34.9)- worsening. Advised strongly to get her BMI under 30 or less and get back to weight reduction and exercise.   5. Elevated LDL cholesterol level- unknown status  Lipid Profile - TSH - T3, free - T4, Free  6. Routine medical exam- routine. FU 1 y.    Erin Furnish RODRIGUEZ-SOUTHWORTH, PA-C    THE PATIENT IS ENCOURAGED TO PRACTICE SOCIAL DISTANCING DUE TO THE COVID-19 PANDEMIC.

## 2019-01-07 LAB — CMP14 + ANION GAP
ALT: 18 IU/L (ref 0–32)
AST: 21 IU/L (ref 0–40)
Albumin/Globulin Ratio: 1.6 (ref 1.2–2.2)
Albumin: 4.7 g/dL (ref 3.8–4.8)
Alkaline Phosphatase: 50 IU/L (ref 39–117)
Anion Gap: 19 mmol/L — ABNORMAL HIGH (ref 10.0–18.0)
BUN/Creatinine Ratio: 17 (ref 9–23)
BUN: 11 mg/dL (ref 6–24)
Bilirubin Total: 0.3 mg/dL (ref 0.0–1.2)
CO2: 20 mmol/L (ref 20–29)
Calcium: 9.9 mg/dL (ref 8.7–10.2)
Chloride: 102 mmol/L (ref 96–106)
Creatinine, Ser: 0.63 mg/dL (ref 0.57–1.00)
GFR calc Af Amer: 121 mL/min/{1.73_m2} (ref >59)
GFR calc non Af Amer: 105 mL/min/{1.73_m2} (ref >59)
Globulin, Total: 3 g/dL (ref 1.5–4.5)
Glucose: 92 mg/dL (ref 65–99)
Potassium: 4.1 mmol/L (ref 3.5–5.2)
Sodium: 141 mmol/L (ref 134–144)
Total Protein: 7.7 g/dL (ref 6.0–8.5)

## 2019-01-07 LAB — CBC
Hematocrit: 37.7 % (ref 34.0–46.6)
Hemoglobin: 12.6 g/dL (ref 11.1–15.9)
MCH: 28.4 pg (ref 26.6–33.0)
MCHC: 33.4 g/dL (ref 31.5–35.7)
MCV: 85 fL (ref 79–97)
Platelets: 260 10*3/uL (ref 150–450)
RBC: 4.44 x10E6/uL (ref 3.77–5.28)
RDW: 13.8 % (ref 11.7–15.4)
WBC: 4.6 10*3/uL (ref 3.4–10.8)

## 2019-01-07 LAB — LIPID PANEL
Chol/HDL Ratio: 3.1 ratio (ref 0.0–4.4)
Cholesterol, Total: 217 mg/dL — ABNORMAL HIGH (ref 100–199)
HDL: 70 mg/dL (ref >39)
LDL Calculated: 134 mg/dL — ABNORMAL HIGH (ref 0–99)
Triglycerides: 64 mg/dL (ref 0–149)
VLDL Cholesterol Cal: 13 mg/dL (ref 5–40)

## 2019-01-07 LAB — T3, FREE: T3, Free: 3.3 pg/mL (ref 2.0–4.4)

## 2019-01-07 LAB — HEMOGLOBIN A1C
Est. average glucose Bld gHb Est-mCnc: 120 mg/dL
Hgb A1c MFr Bld: 5.8 % — ABNORMAL HIGH (ref 4.8–5.6)

## 2019-01-07 LAB — TSH: TSH: 0.817 u[IU]/mL (ref 0.450–4.500)

## 2019-01-07 LAB — T4, FREE: Free T4: 1.13 ng/dL (ref 0.82–1.77)

## 2019-01-07 LAB — HIV ANTIBODY (ROUTINE TESTING W REFLEX): HIV Screen 4th Generation wRfx: NONREACTIVE

## 2019-02-07 MED FILL — TAMOXIFEN 20 MG TABLET: 20 | 90 days supply | Qty: 90 | Fill #2

## 2019-03-14 MED FILL — OLMESARTAN-HCTZ 20-12.5 MG: 20-12.5 | 90 days supply | Qty: 90 | Fill #0

## 2019-04-14 ENCOUNTER — Ambulatory Visit: Payer: 59 | Admitting: Internal Medicine

## 2019-04-14 ENCOUNTER — Other Ambulatory Visit: Payer: Self-pay

## 2019-04-14 VITALS — BP 124/76 | HR 87 | Temp 98.6°F | Ht 64.2 in | Wt 187.8 lb

## 2019-04-14 DIAGNOSIS — R7303 Prediabetes: Secondary | ICD-10-CM | POA: Diagnosis not present

## 2019-04-14 DIAGNOSIS — E6609 Other obesity due to excess calories: Secondary | ICD-10-CM | POA: Diagnosis not present

## 2019-04-14 DIAGNOSIS — E7800 Pure hypercholesterolemia, unspecified: Secondary | ICD-10-CM | POA: Diagnosis not present

## 2019-04-14 DIAGNOSIS — I10 Essential (primary) hypertension: Secondary | ICD-10-CM | POA: Diagnosis not present

## 2019-04-14 DIAGNOSIS — Z6832 Body mass index (BMI) 32.0-32.9, adult: Secondary | ICD-10-CM

## 2019-04-14 NOTE — Progress Notes (Signed)
Subjective:     Patient ID: Erin Bowers , female    DOB: [birth year] , 53 y.o.   MRN: [id number]   Chief Complaint  Patient presents with  . Hypertension    HPI Pt is here for HTN FU. Has not been watching her wt, but feels she has gained wt. Has not been exercising. She states she knows what she needs to do, but does not follow through.   Past Medical History:  Diagnosis Date  . Blood transfusion without reported diagnosis     15 years ago  . Breast cancer ([place])   . Heart murmur   . Hypertension   . Personal history of radiation therapy 2017     Family History  Problem Relation Age of Onset  . Stroke Mother   . Hypertension Mother   . Stroke Father   . Colon cancer Neg Hx   . Esophageal cancer Neg Hx   . Rectal cancer Neg Hx   . Stomach cancer Neg Hx      Current Outpatient Medications:  .  Ascorbic Acid (VITAMIN C) 1000 MG tablet, Take 1,000 mg by mouth daily., Disp: , Rfl:  .  Cholecalciferol (D3-1000) 1000 units capsule, Take 1,000 Units by mouth daily., Disp: , Rfl:  .  Multiple Vitamin (MULTIVITAMIN) tablet, Take 1 tablet by mouth daily., Disp: , Rfl:  .  olmesartan-hydrochlorothiazide (BENICAR HCT) 20-12.5 MG tablet, TAKE 1 TABLET BY MOUTH DAILY, Disp: 90 tablet, Rfl: 0 .  Omega-3 Fatty Acids (FISH OIL) 1000 MG CAPS, Take by mouth., Disp: , Rfl:  .  tamoxifen (NOLVADEX) 20 MG tablet, Take 1 tablet (20 mg total) by mouth daily., Disp: 90 tablet, Rfl: 3   Allergies  Allergen Reactions  . Adhesive [Tape] Other (See Comments)    Steri-strips cause blisters      Review of Systems   Constitutional: She is oriented to person, place, and time. She appears well-developed and well-nourished. No distress.  HENT:  Head: Normocephalic and atraumatic.  Right Ear: External ear normal.  Left Ear: External ear normal.  Nose: Nose normal.  Eyes: Conjunctivae are normal. Right eye exhibits no discharge. Left eye exhibits no discharge. No scleral icterus.  Neck: Neck  supple. No thyromegaly present.  No carotid bruits bilaterally  Cardiovascular: Normal rate and regular rhythm.  No murmur heard. Pulmonary/Chest: Effort normal and breath sounds normal. No respiratory distress.  Musculoskeletal: Normal range of motion. She exhibits no edema.  Lymphadenopathy:    She has no cervical adenopathy.  Neurological: She is alert and oriented to person, place, and time.  Skin: Skin is warm and dry. Capillary refill takes less than 2 seconds. No rash noted. She is not diaphoretic.  Psychiatric: She has a normal mood and affect. Her behavior is normal. Judgment and thought content normal.  Nursing note reviewed.  Today's Vitals   04/14/19 1554  BP: 124/76  Pulse: 87  Temp: 98.6 F (37 C)  TempSrc: Oral  SpO2: 96%  Weight: 187 lb 12.8 oz (85.2 kg)  Height: 5' 4.2" (1.631 m)   Body mass index is 32.04 kg/m.   Objective:  Physical Exam   Constitutional: She is oriented to person, place, and time. She appears well-developed and well-nourished. No distress.  HENT:  Head: Normocephalic and atraumatic.  Right Ear: External ear normal.  Left Ear: External ear normal.  Nose: Nose normal.  Eyes: Conjunctivae are normal. Right eye exhibits no discharge. Left eye exhibits no discharge. No scleral icterus.  Neck: Neck supple. No thyromegaly present.  No carotid bruits bilaterally  Cardiovascular: Normal rate and regular rhythm.  No murmur heard. Pulmonary/Chest: Effort normal and breath sounds normal. No respiratory distress.  Musculoskeletal: Normal range of motion. She exhibits no edema.  Lymphadenopathy:    She has no cervical adenopathy.  Neurological: She is alert and oriented to person, place, and time.  Skin: Skin is warm and dry. Capillary refill takes less than 2 seconds. No rash noted. She is not diaphoretic.  Psychiatric: She has a normal mood and affect. Her behavior is normal. Judgment and thought content normal.  Nursing note reviewed.      Assessment And Plan:  1. Essential hypertension- stable. May continue current med.  - CMP14 + Anion Gap - CBC with Diff FU 3 months 2. Prediabetes- chronic  - Hemoglobin A1c  3. Elevated LDL cholesterol level- chronic.   4. Class 1 obesity due to excess calories with serious comorbidity and body mass index (BMI) of 32.0 to 32.9 in adult- chronic. I discussed with her if she was interested in trying an appetite Supressant and she will check with her insurance which weight loss medications is covered and after reviewing it she will call me if interested in taking it.        Tila Millirons RODRIGUEZ-SOUTHWORTH, PA-C    THE PATIENT IS ENCOURAGED TO PRACTICE SOCIAL DISTANCING DUE TO THE COVID-19 PANDEMIC.

## 2019-04-15 LAB — CMP14 + ANION GAP
ALT: 15 IU/L (ref 0–32)
AST: 20 IU/L (ref 0–40)
Albumin/Globulin Ratio: 1.5 (ref 1.2–2.2)
Albumin: 4.4 g/dL (ref 3.8–4.9)
Alkaline Phosphatase: 56 IU/L (ref 39–117)
Anion Gap: 13 mmol/L (ref 10.0–18.0)
BUN/Creatinine Ratio: 18 (ref 9–23)
BUN: 13 mg/dL (ref 6–24)
Bilirubin Total: <0.2 mg/dL (ref 0.0–1.2)
CO2: 23 mmol/L (ref 20–29)
Calcium: 9.6 mg/dL (ref 8.7–10.2)
Chloride: 104 mmol/L (ref 96–106)
Creatinine, Ser: 0.73 mg/dL (ref 0.57–1.00)
GFR calc Af Amer: 110 mL/min/{1.73_m2} (ref >59)
GFR calc non Af Amer: 96 mL/min/{1.73_m2} (ref >59)
Globulin, Total: 2.9 g/dL (ref 1.5–4.5)
Glucose: 86 mg/dL (ref 65–99)
Potassium: 3.9 mmol/L (ref 3.5–5.2)
Sodium: 140 mmol/L (ref 134–144)
Total Protein: 7.3 g/dL (ref 6.0–8.5)

## 2019-04-15 LAB — CBC WITH DIFFERENTIAL/PLATELET
Basophils Absolute: 0 10*3/uL (ref 0.0–0.2)
Basos: 0 %
EOS (ABSOLUTE): 0.1 10*3/uL (ref 0.0–0.4)
Eos: 1 %
Hematocrit: 35.7 % (ref 34.0–46.6)
Hemoglobin: 12 g/dL (ref 11.1–15.9)
Immature Grans (Abs): 0 10*3/uL (ref 0.0–0.1)
Immature Granulocytes: 0 %
Lymphocytes Absolute: 3 10*3/uL (ref 0.7–3.1)
Lymphs: 41 %
MCH: 27.9 pg (ref 26.6–33.0)
MCHC: 33.6 g/dL (ref 31.5–35.7)
MCV: 83 fL (ref 79–97)
Monocytes Absolute: 0.7 10*3/uL (ref 0.1–0.9)
Monocytes: 9 %
Neutrophils Absolute: 3.5 10*3/uL (ref 1.4–7.0)
Neutrophils: 49 %
Platelets: 284 10*3/uL (ref 150–450)
RBC: 4.3 x10E6/uL (ref 3.77–5.28)
RDW: 13.3 % (ref 11.7–15.4)
WBC: 7.3 10*3/uL (ref 3.4–10.8)

## 2019-04-15 LAB — HEMOGLOBIN A1C
Est. average glucose Bld gHb Est-mCnc: 123 mg/dL
Hgb A1c MFr Bld: 5.9 % — ABNORMAL HIGH (ref 4.8–5.6)

## 2019-04-20 ENCOUNTER — Other Ambulatory Visit: Payer: Self-pay | Admitting: Internal Medicine

## 2019-04-20 DIAGNOSIS — Z9889 Other specified postprocedural states: Secondary | ICD-10-CM

## 2019-05-10 MED FILL — TAMOXIFEN 20 MG TABLET: 20 | 90 days supply | Qty: 90 | Fill #3

## 2019-05-23 ENCOUNTER — Other Ambulatory Visit: Payer: Self-pay

## 2019-05-23 ENCOUNTER — Encounter: Payer: Self-pay | Admitting: Obstetrics & Gynecology

## 2019-05-23 ENCOUNTER — Ambulatory Visit
Admission: RE | Admit: 2019-05-23 | Discharge: 2019-05-23 | Disposition: A | Discharge status: Home / Self Care | Payer: 59 | Source: Ambulatory Visit | Attending: Internal Medicine | Admitting: Internal Medicine

## 2019-05-23 ENCOUNTER — Ambulatory Visit (INDEPENDENT_AMBULATORY_CARE_PROVIDER_SITE_OTHER): Payer: 59 | Admitting: Obstetrics & Gynecology

## 2019-05-23 VITALS — BP 130/80 | Wt 183.0 lb

## 2019-05-23 DIAGNOSIS — Z01419 Encounter for gynecological examination (general) (routine) without abnormal findings: Secondary | ICD-10-CM

## 2019-05-23 DIAGNOSIS — Z6831 Body mass index (BMI) 31.0-31.9, adult: Secondary | ICD-10-CM

## 2019-05-23 DIAGNOSIS — Z170 Estrogen receptor positive status [ER+]: Secondary | ICD-10-CM | POA: Diagnosis not present

## 2019-05-23 DIAGNOSIS — Z780 Asymptomatic menopausal state: Secondary | ICD-10-CM | POA: Diagnosis not present

## 2019-05-23 DIAGNOSIS — Z90710 Acquired absence of both cervix and uterus: Secondary | ICD-10-CM | POA: Diagnosis not present

## 2019-05-23 DIAGNOSIS — E6609 Other obesity due to excess calories: Secondary | ICD-10-CM | POA: Diagnosis not present

## 2019-05-23 DIAGNOSIS — Z9889 Other specified postprocedural states: Secondary | ICD-10-CM

## 2019-05-23 DIAGNOSIS — R928 Other abnormal and inconclusive findings on diagnostic imaging of breast: Secondary | ICD-10-CM | POA: Diagnosis not present

## 2019-05-23 DIAGNOSIS — C50212 Malignant neoplasm of upper-inner quadrant of left female breast: Secondary | ICD-10-CM

## 2019-05-23 DIAGNOSIS — Z853 Personal history of malignant neoplasm of breast: Secondary | ICD-10-CM

## 2019-05-23 NOTE — Patient Instructions (Signed)
1. Well female exam with routine gynecological exam Gynecologic exam status post TAH and menopause.  Pap reflex in December 2019 was negative, no indication to repeat this year.  Breast exam normal.  Colono 08/2018.  Health labs with Fam MD.  2. S/P TAH (total abdominal hysterectomy)  3. Postmenopause Postmenopausal, well on no hormone replacement therapy.  Taking vitamin D supplements, calcium daily recommendation at 1200 mg, regular weightbearing physical activities.  4. Malignant neoplasm of upper-inner quadrant of left breast in female, estrogen receptor positive ([place]) Left breast cancer on tamoxifen x3 years.  Screening mammogram done today was benign.  5. Class 1 obesity due to excess calories without serious comorbidity with body mass index (BMI) of 31.0 to 31.9 in adult Recommend a lower calorie/carb diet such as [employer].  Aerobic physical activities 5 times a week and light weight lifting every 2 days.  Jeni, it was a pleasure seeing you today!

## 2019-05-23 NOTE — Progress Notes (Signed)
Erin Bowers [birth year] [id number]   History:    53 y.o. G0 Divorced  RP:  Established patient presenting for annual gyn exam   HPI: Status post TAH with cauterization of the left ovarian endometrioma in 2005.  No pelvic pain.  Currently abstinent.  Left high-grade DCIS with lumpectomy in 2017.  Had radiation therapy and is now on tamoxifen x 3 yrs.  No genetic screening done.  Breast normal currently.  Body mass index 31.22  Not very physically active. Colono 08/2018.  Health labs done today with family physician.   Past medical history,surgical history, family history and social history were all reviewed and documented in the EPIC chart.  Gynecologic History No LMP recorded. Patient has had a hysterectomy.  Obstetric History OB History  Gravida Para Term Preterm AB Living  0 0 0 0 0 0  SAB TAB Ectopic Multiple Live Births  0 0 0 0 0     ROS: A ROS was performed and pertinent positives and negatives are included in the history.  GENERAL: No fevers or chills. HEENT: No change in vision, no earache, sore throat or sinus congestion. NECK: No pain or stiffness. CARDIOVASCULAR: No chest pain or pressure. No palpitations. PULMONARY: No shortness of breath, cough or wheeze. GASTROINTESTINAL: No abdominal pain, nausea, vomiting or diarrhea, melena or bright red blood per rectum. GENITOURINARY: No urinary frequency, urgency, hesitancy or dysuria. MUSCULOSKELETAL: No joint or muscle pain, no back pain, no recent trauma. DERMATOLOGIC: No rash, no itching, no lesions. ENDOCRINE: No polyuria, polydipsia, no heat or cold intolerance. No recent change in weight. HEMATOLOGICAL: No anemia or easy bruising or bleeding. NEUROLOGIC: No headache, seizures, numbness, tingling or weakness. PSYCHIATRIC: No depression, no loss of interest in normal activity or change in sleep pattern.     Exam:   BP 130/80   Wt 183 lb (83 kg)   BMI 31.22 kg/m   Body mass index is 31.22 kg/m.  General appearance  : Well developed well nourished female. No acute distress HEENT: Eyes: no retinal hemorrhage or exudates,  Neck supple, trachea midline, no carotid bruits, no thyroidmegaly Lungs: Clear to auscultation, no rhonchi or wheezes, or rib retractions  Heart: Regular rate and rhythm, no murmurs or gallops Breast:Examined in sitting and supine position were symmetrical in appearance, no palpable masses or tenderness,  no skin retraction, no nipple inversion, no nipple discharge, no skin discoloration, no axillary or supraclavicular lymphadenopathy Abdomen: no palpable masses or tenderness, no rebound or guarding Extremities: no edema or skin discoloration or tenderness  Pelvic: Vulva: Normal             Vagina: No gross lesions or discharge  Cervix/Uterus absent  Adnexa  Without masses or tenderness  Anus: Normal   Assessment/Plan:  53 y.o. female for annual exam   1. Well female exam with routine gynecological exam Gynecologic exam status post TAH and menopause.  Pap reflex in December 2019 was negative, no indication to repeat this year.  Breast exam normal.  Colono 08/2018.  Health labs with Fam MD.  2. S/P TAH (total abdominal hysterectomy)  3. Postmenopause Postmenopausal, well on no hormone replacement therapy.  Taking vitamin D supplements, calcium daily recommendation at 1200 mg, regular weightbearing physical activities.  4. Malignant neoplasm of upper-inner quadrant of left breast in female, estrogen receptor positive ([place]) Left breast cancer on tamoxifen x3 years.  Screening mammogram done today was benign.  5. Class 1 obesity due to excess calories without serious comorbidity  with body mass index (BMI) of 31.0 to 31.9 in adult Recommend a lower calorie/carb diet such as [employer].  Aerobic physical activities 5 times a week and light weight lifting every 2 days.  Princess Bruins MD, 2:57 PM 05/23/2019

## 2019-06-13 ENCOUNTER — Other Ambulatory Visit: Payer: Self-pay | Admitting: Internal Medicine

## 2019-06-13 DIAGNOSIS — E782 Mixed hyperlipidemia: Secondary | ICD-10-CM

## 2019-06-13 MED FILL — OLMESARTAN-HCTZ 20-12.5 MG: 20-12.5 | 90 days supply | Qty: 90 | Fill #0

## 2019-07-03 NOTE — Progress Notes (Signed)
Patient Care Team: Rodriguez-Southworth, Sunday Spillers, PA-C as PCP - General (Internal Medicine) Jovita Kussmaul, MD as Consulting Physician (General Surgery) Nicholas Lose, MD as Consulting Physician (Hematology and Oncology) Thea Silversmith, MD as Consulting Physician (Radiation Oncology) Sylvan Cheese, NP as Nurse Practitioner (Hematology and Oncology)  DIAGNOSIS:    ICD-10-CM   1. Malignant neoplasm of upper-inner quadrant of left breast in female, estrogen receptor positive ([place])  C50.212    Z17.0     SUMMARY OF ONCOLOGIC HISTORY: Oncology History  Breast cancer of upper-inner quadrant of left female breast ([place])  06/25/2015 Mammogram   Screening detected left breast calcifications posterior depth 4.4 x 3.3 x 2.3 cm   06/27/2015 Initial Diagnosis   Left breast biopsy upper quadrant: DCIS with comedo necrosis and calcification, grade 2-3, ER 100%, PR 15%, Tis N0 stage 0 clinical stage   08/03/2015 Surgery   Left lumpectomy Marlou Starks): Intermediate to high-grade DCIS with comedonecrosis, DCIS close superior margin, excision left superior margin DCIS less than 0.1 cm, additional superior margin still showed DCIS focally positive, ER 100%, PR 15%   08/17/2015 Surgery   Left superior margin excision: Less than 0.2 cm residual high-grade DCIS; left additional superior/medial margin: Benign   09/26/2015 - 10/24/2015 Radiation Therapy   Adjuvant radiation therapy [name]). Left breast/ 42.72 Gy at 2.67 Gy per fraction x 16 fractions. 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 with above-mentioned history of left breast DCIS who is currently on tamoxifen therapy. Mammogram on 05/23/19 showed no evidence of malignancy bilaterally. She presents to the clinic alone today.    ALLERGIES:  is allergic to adhesive [tape].  MEDICATIONS:  Current Outpatient Medications  Medication Sig Dispense Refill  . Ascorbic Acid (VITAMIN C) 1000 MG tablet Take 1,000 mg by mouth daily.    . Cholecalciferol (D3-1000) 1000 units capsule Take 1,000 Units by mouth daily.    . Multiple Vitamin (MULTIVITAMIN) tablet Take 1 tablet by mouth daily.    Marland Kitchen olmesartan-hydrochlorothiazide (BENICAR HCT) 20-12.5 MG tablet TAKE 1 TABLET BY MOUTH DAILY 90 tablet 0  . Omega-3 Fatty Acids (FISH OIL) 1000 MG CAPS Take by mouth.    . tamoxifen (NOLVADEX) 20 MG tablet Take 1 tablet (20 mg total) by mouth daily. 90 tablet 3   No current facility-administered medications for this visit.    PHYSICAL EXAMINATION: ECOG PERFORMANCE STATUS: 1 - Symptomatic but completely ambulatory  There were no vitals filed for this visit. There were no vitals filed for this visit.  BREAST: No palpable masses or nodules in either right or left breasts. No palpable axillary supraclavicular or infraclavicular adenopathy no breast tenderness or nipple discharge. (exam performed in the presence of a chaperone)  LABORATORY DATA:  I have reviewed the data as listed CMP Latest Ref Rng & Units 04/14/2019 01/06/2019 08/24/2018  Glucose 65 - 99 mg/dL 86 92 86  BUN 6 - 24 mg/dL 13 11 16   Creatinine 0.57 - 1.00 mg/dL 0.73 0.63 0.81  Sodium 134 - 144 mmol/L 140 141 142  Potassium 3.5 - 5.2 mmol/L 3.9 4.1 4.0  Chloride 96 - 106 mmol/L 104 102 105  CO2 20 - 29 mmol/L 23 20 21   Calcium 8.7 - 10.2 mg/dL 9.6 9.9 9.7  Total Protein 6.0 - 8.5 g/dL 7.3 7.7 7.0  Total Bilirubin 0.0 - 1.2 mg/dL <0.2 0.3 <0.2  Alkaline Phos 39 - 117 IU/L 56 50 49  AST 0 - 40 IU/L 20 21 20   ALT 0 - 32 IU/L 15 18 19     Lab Results  Component Value Date   WBC 7.3 04/14/2019   HGB 12.0 04/14/2019   HCT 35.7 04/14/2019   MCV 83 04/14/2019   PLT 284 04/14/2019   NEUTROABS 3.5 04/14/2019    ASSESSMENT & PLAN:  Breast cancer of upper-inner  quadrant of left female breast ([place]) Left lumpectomy 08/03/2015 : Intermediate to high-grade DCIS with comedonecrosis, DCIS close superior margin, excision left superior margin DCIS less than 0.1 cm, additional superior margin still showed DCIS focally positive, ER 100%, PR 15% Reexcision of margins 08/17/2015: 0.2 cm residual DCIS but final margins negative Pathologic stage: Tis N0 stage 0 Adjuvant radiation therapy from 09/26/2015 to 10/24/2015  Current treatment: Tamoxifen 20 mg daily 5 years started 11/23/2015  Tamoxifen toxicities: 1. Hot flashes mild Denies any musculoskeletal aches and pains.  Breast Cancer Surveillance: 1. Breast examon  2019-07-04: Benign 2. Mammogram2020-12-14no abnormalities. Postsurgical changes. Breast Density Category B.  Return to clinic in 1 year for follow-up    No orders of the defined types were placed in this encounter.  The patient has a good understanding of the overall plan. she agrees with it. she will call with any problems that may develop before the next visit here.  Total time spent: 15 mins including face to face time and time spent for planning, charting and coordination of care  Nicholas Lose, MD 07/04/2019  I, Cloyde Reams Dorshimer, am acting as scribe for Dr. Nicholas Lose.  I have reviewed the above documentation for accuracy and completeness, and I agree with the above.

## 2019-07-04 ENCOUNTER — Other Ambulatory Visit: Payer: Self-pay

## 2019-07-04 ENCOUNTER — Inpatient Hospital Stay: Payer: 59 | Attending: Hematology and Oncology | Admitting: Hematology and Oncology

## 2019-07-04 DIAGNOSIS — Z170 Estrogen receptor positive status [ER+]: Secondary | ICD-10-CM | POA: Diagnosis not present

## 2019-07-04 DIAGNOSIS — Z923 Personal history of irradiation: Secondary | ICD-10-CM | POA: Insufficient documentation

## 2019-07-04 DIAGNOSIS — R232 Flushing: Secondary | ICD-10-CM | POA: Diagnosis not present

## 2019-07-04 DIAGNOSIS — C50212 Malignant neoplasm of upper-inner quadrant of left female breast: Secondary | ICD-10-CM | POA: Diagnosis not present

## 2019-07-04 DIAGNOSIS — Z79899 Other long term (current) drug therapy: Secondary | ICD-10-CM | POA: Insufficient documentation

## 2019-07-04 MED ORDER — TAMOXIFEN CITRATE 20 MG PO TABS: 20.0000 mg | ORAL_TABLET | Freq: Every day | ORAL | 3 refills | Status: DC

## 2019-07-04 NOTE — Assessment & Plan Note (Signed)
Left lumpectomy 08/03/2015 : Intermediate to high-grade DCIS with comedonecrosis, DCIS close superior margin, excision left superior margin DCIS less than 0.1 cm, additional superior margin still showed DCIS focally positive, ER 100%, PR 15% Reexcision of margins 08/17/2015: 0.2 cm residual DCIS but final margins negative Pathologic stage: Tis N0 stage 0 Adjuvant radiation therapy from 09/26/2015 to 10/24/2015  Current treatment: Tamoxifen 20 mg daily 5 years started 11/23/2015  Tamoxifen toxicities: 1. Hot flashes mild Denies any musculoskeletal aches and pains.  Breast Cancer Surveillance: 1. Breast examon  2019-07-04: Benign 2. Mammogram2020-12-14no abnormalities. Postsurgical changes. Breast Density Category B.  Return to clinic in 1 year for follow-up

## 2019-07-05 ENCOUNTER — Telehealth: Payer: Self-pay | Admitting: Hematology and Oncology

## 2019-07-05 NOTE — Telephone Encounter (Signed)
Scheduled per 1/25 los. Called and left msg. Mailing printout

## 2019-07-11 ENCOUNTER — Telehealth: Payer: Self-pay

## 2019-07-11 NOTE — Telephone Encounter (Signed)
LVM for pt to call the office to have appt rescheduled due to provider schedule being changed

## 2019-07-21 ENCOUNTER — Ambulatory Visit: Payer: 59 | Admitting: Internal Medicine

## 2019-07-28 ENCOUNTER — Ambulatory Visit: Payer: 59 | Admitting: Internal Medicine

## 2019-08-03 ENCOUNTER — Ambulatory Visit: Payer: 59 | Admitting: Internal Medicine

## 2019-08-03 ENCOUNTER — Other Ambulatory Visit: Payer: Self-pay

## 2019-08-03 VITALS — BP 128/86 | HR 88 | Temp 98.5°F | Ht 64.2 in | Wt 185.0 lb

## 2019-08-03 DIAGNOSIS — I10 Essential (primary) hypertension: Secondary | ICD-10-CM | POA: Diagnosis not present

## 2019-08-03 DIAGNOSIS — R7303 Prediabetes: Secondary | ICD-10-CM

## 2019-08-03 DIAGNOSIS — E6609 Other obesity due to excess calories: Secondary | ICD-10-CM | POA: Diagnosis not present

## 2019-08-03 DIAGNOSIS — Z6832 Body mass index (BMI) 32.0-32.9, adult: Secondary | ICD-10-CM

## 2019-08-03 NOTE — Progress Notes (Signed)
This visit occurred during the SARS-CoV-2 public health emergency.  Safety protocols were in place, including screening questions prior to the visit, additional usage of staff PPE, and extensive cleaning of exam room while observing appropriate contact time as indicated for disinfecting solutions.  Subjective:     Patient ID: Erin Bowers , female    DOB: [birth year] , 54 y.o.   MRN: [id number]   Chief Complaint  Patient presents with  . Hypertension    HPI Here for HTN FU. Has not been able to get an answer about wt loss meds when she called her insurance. Has cut down on carbs, but not exercised as much as she wished.     Past Medical History:  Diagnosis Date  . Blood transfusion without reported diagnosis     15 years ago  . Breast cancer ([place])   . Heart murmur   . Hypertension   . Personal history of radiation therapy 2017     Family History  Problem Relation Age of Onset  . Stroke Mother   . Hypertension Mother   . Stroke Father   . Colon cancer Neg Hx   . Esophageal cancer Neg Hx   . Rectal cancer Neg Hx   . Stomach cancer Neg Hx      Current Outpatient Medications:  .  Ascorbic Acid (VITAMIN C) 1000 MG tablet, Take 1,000 mg by mouth daily., Disp: , Rfl:  .  Cholecalciferol (D3-1000) 1000 units capsule, Take 1,000 Units by mouth daily., Disp: , Rfl:  .  Multiple Vitamin (MULTIVITAMIN) tablet, Take 1 tablet by mouth daily., Disp: , Rfl:  .  olmesartan-hydrochlorothiazide (BENICAR HCT) 20-12.5 MG tablet, TAKE 1 TABLET BY MOUTH DAILY, Disp: 90 tablet, Rfl: 0 .  Omega-3 Fatty Acids (FISH OIL) 1000 MG CAPS, Take by mouth., Disp: , Rfl:  .  tamoxifen (NOLVADEX) 20 MG tablet, Take 1 tablet (20 mg total) by mouth daily., Disp: 90 tablet, Rfl: 3   Allergies  Allergen Reactions  . Adhesive [Tape] Other (See Comments)    Steri-strips cause blisters        Review of Systems  Constitutional: Negative for diaphoresis and unexpected weight change.  HENT: Negative for  tinnitus.   Eyes: Negative for visual disturbance.  Respiratory: Negative for chest tightness and shortness of breath.   Cardiovascular: Negative for chest pain, palpitations and leg swelling.  Gastrointestinal: Negative for constipation, diarrhea and nausea.  Endocrine: Negative for polydipsia, polyphagia and polyuria.  Genitourinary: Negative for dysuria and frequency.  Skin: Negative for rash and wound.  Neurological: Negative for dizziness, speech difficulty, weakness, numbness and headaches.  Today's Vitals   08/03/19 1628  BP: 128/86  Pulse: 88  Temp: 98.5 F (36.9 C)  TempSrc: Oral  SpO2: 96%  Weight: 185 lb (83.9 kg)  Height: 5' 4.2" (1.631 m)   Body mass index is 31.56 kg/m.   Objective:  Physical Exam  Wt down 2 lbs  Constitutional: She is oriented to person, place, and time. She appears well-developed and well-nourished. No distress.  HENT:  Head: Normocephalic and atraumatic.  Right Ear: External ear normal.  Left Ear: External ear normal.  Nose: Nose normal.  Eyes: Conjunctivae are normal. Right eye exhibits no discharge. Left eye exhibits no discharge. No scleral icterus.  Neck: Neck supple. No thyromegaly present.  No carotid bruits bilaterally  Cardiovascular: Normal rate and regular rhythm.  No murmur heard. Pulmonary/Chest: Effort normal and breath sounds normal. No respiratory distress.  Musculoskeletal: Normal range  of motion. She exhibits no edema.  Lymphadenopathy:    She has no cervical adenopathy.  Neurological: She is alert and oriented to person, place, and time.  Skin: Skin is warm and dry. Capillary refill takes less than 2 seconds. No rash noted. She is not diaphoretic.  Psychiatric: She has a normal mood and affect. Her behavior is normal. Judgment and thought content normal.  Nursing note reviewed.  Assessment And Plan:     1. Prediabetes- chronic. Will stay on low carb and sugar diet - Hemoglobin A1c - Insulin, random(561)  2.  Essential hypertension- stable. May continue same meds. Fu 3 months.  - CBC no Diff - CMP14 + Anion Gap  3. Class 1 obesity due to excess calories with serious comorbidity and body mass index (BMI) of 32.0 to 32.9 in adult- chronic. With slow wt loss. May be insuline resistant.   Viridiana Spaid RODRIGUEZ-SOUTHWORTH, PA-C    THE PATIENT IS ENCOURAGED TO PRACTICE SOCIAL DISTANCING DUE TO THE COVID-19 PANDEMIC.

## 2019-08-04 LAB — CBC
Hematocrit: 35.6 % (ref 34.0–46.6)
Hemoglobin: 12.1 g/dL (ref 11.1–15.9)
MCH: 27.9 pg (ref 26.6–33.0)
MCHC: 34 g/dL (ref 31.5–35.7)
MCV: 82 fL (ref 79–97)
Platelets: 298 10*3/uL (ref 150–450)
RBC: 4.33 x10E6/uL (ref 3.77–5.28)
RDW: 13.7 % (ref 11.7–15.4)
WBC: 6.3 10*3/uL (ref 3.4–10.8)

## 2019-08-04 LAB — HEMOGLOBIN A1C
Est. average glucose Bld gHb Est-mCnc: 117 mg/dL
Hgb A1c MFr Bld: 5.7 % — ABNORMAL HIGH (ref 4.8–5.6)

## 2019-08-04 LAB — CMP14 + ANION GAP
ALT: 16 IU/L (ref 0–32)
AST: 16 IU/L (ref 0–40)
Albumin/Globulin Ratio: 1.6 (ref 1.2–2.2)
Albumin: 4.4 g/dL (ref 3.8–4.9)
Alkaline Phosphatase: 54 IU/L (ref 39–117)
Anion Gap: 16 mmol/L (ref 10.0–18.0)
BUN/Creatinine Ratio: 19 (ref 9–23)
BUN: 13 mg/dL (ref 6–24)
Bilirubin Total: <0.2 mg/dL (ref 0.0–1.2)
CO2: 22 mmol/L (ref 20–29)
Calcium: 9.8 mg/dL (ref 8.7–10.2)
Chloride: 102 mmol/L (ref 96–106)
Creatinine, Ser: 0.67 mg/dL (ref 0.57–1.00)
GFR calc Af Amer: 118 mL/min/{1.73_m2} (ref >59)
GFR calc non Af Amer: 102 mL/min/{1.73_m2} (ref >59)
Globulin, Total: 2.8 g/dL (ref 1.5–4.5)
Glucose: 80 mg/dL (ref 65–99)
Potassium: 4.2 mmol/L (ref 3.5–5.2)
Sodium: 140 mmol/L (ref 134–144)
Total Protein: 7.2 g/dL (ref 6.0–8.5)

## 2019-08-04 LAB — INSULIN, RANDOM: INSULIN: 12.7 u[IU]/mL (ref 2.6–24.9)

## 2019-08-08 MED FILL — TAMOXIFEN 20 MG TABLET: 20 | 90 days supply | Qty: 90 | Fill #0

## 2019-08-11 ENCOUNTER — Encounter: Payer: Self-pay | Admitting: Internal Medicine

## 2019-09-07 ENCOUNTER — Other Ambulatory Visit: Payer: Self-pay | Admitting: Internal Medicine

## 2019-09-07 DIAGNOSIS — E782 Mixed hyperlipidemia: Secondary | ICD-10-CM

## 2019-09-07 MED FILL — OLMESARTAN-HCTZ 20-12.5 MG: 20-12.5 | 90 days supply | Qty: 90 | Fill #0

## 2019-10-27 ENCOUNTER — Encounter: Payer: Self-pay | Admitting: Internal Medicine

## 2019-10-27 ENCOUNTER — Other Ambulatory Visit: Payer: Self-pay

## 2019-10-27 ENCOUNTER — Ambulatory Visit: Payer: 59 | Admitting: Internal Medicine

## 2019-10-27 VITALS — BP 120/76 | HR 90 | Temp 98.6°F | Ht 64.2 in | Wt 187.4 lb

## 2019-10-27 DIAGNOSIS — E559 Vitamin D deficiency, unspecified: Secondary | ICD-10-CM | POA: Diagnosis not present

## 2019-10-27 DIAGNOSIS — R7309 Other abnormal glucose: Secondary | ICD-10-CM

## 2019-10-27 DIAGNOSIS — I10 Essential (primary) hypertension: Secondary | ICD-10-CM | POA: Diagnosis not present

## 2019-10-27 DIAGNOSIS — E782 Mixed hyperlipidemia: Secondary | ICD-10-CM

## 2019-10-27 DIAGNOSIS — Z6832 Body mass index (BMI) 32.0-32.9, adult: Secondary | ICD-10-CM

## 2019-10-27 DIAGNOSIS — E6609 Other obesity due to excess calories: Secondary | ICD-10-CM | POA: Diagnosis not present

## 2019-10-27 MED ORDER — OLMESARTAN MEDOXOMIL-HCTZ 20-12.5 MG PO TABS: 1.0000 | ORAL_TABLET | Freq: Every day | ORAL | 0 refills | Status: DC

## 2019-10-27 NOTE — Progress Notes (Signed)
This visit occurred during the SARS-CoV-2 public health emergency.  Safety protocols were in place, including screening questions prior to the visit, additional usage of staff PPE, and extensive cleaning of exam room while observing appropriate contact time as indicated for disinfecting solutions.  Subjective:     Patient ID: Erin Bowers , female    DOB: 01-16-1966 , 54 y.o.   MRN: [id number]   Chief Complaint  Patient presents with  . Hypertension    HPI  Pt is here for HTN Fu. States she had lost wt but went back into her old habits and been eating more things she should not eat.   Past Medical History:  Diagnosis Date  . Blood transfusion without reported diagnosis     15 years ago  . Breast cancer ([place])   . Heart murmur   . Hypertension   . Personal history of radiation therapy 2017     Family History  Problem Relation Age of Onset  . Stroke Mother   . Hypertension Mother   . Stroke Father   . Colon cancer Neg Hx   . Esophageal cancer Neg Hx   . Rectal cancer Neg Hx   . Stomach cancer Neg Hx      Current Outpatient Medications:  .  Ascorbic Acid (VITAMIN C) 1000 MG tablet, Take 1,000 mg by mouth daily., Disp: , Rfl:  .  Cholecalciferol (D3-1000) 1000 units capsule, Take 1,000 Units by mouth daily., Disp: , Rfl:  .  Multiple Vitamin (MULTIVITAMIN) tablet, Take 1 tablet by mouth daily., Disp: , Rfl:  .  olmesartan-hydrochlorothiazide (BENICAR HCT) 20-12.5 MG tablet, TAKE 1 TABLET BY MOUTH DAILY, Disp: 90 tablet, Rfl: 0 .  Omega-3 Fatty Acids (FISH OIL) 1000 MG CAPS, Take by mouth., Disp: , Rfl:  .  tamoxifen (NOLVADEX) 20 MG tablet, Take 1 tablet (20 mg total) by mouth daily., Disp: 90 tablet, Rfl: 3   Allergies  Allergen Reactions  . Adhesive [Tape] Other (See Comments)    Steri-strips cause blisters      Review of Systems  + weight gain, the rest of 10 point ROS is neg.  Today's Vitals   10/27/19 1539  BP: 120/76  Pulse: 90  Temp: 98.6 F (37 C)   TempSrc: Oral  Weight: 187 lb 6.4 oz (85 kg)  Height: 5' 4.2" (1.631 m)  PainSc: 0-No pain   Body mass index is 31.97 kg/m.   Objective:  Physical Exam  Wt is up 2 lbs    Constitutional: She is oriented to person, place, and time. She appears well-developed and well-nourished. No distress.  HENT:  Head: Normocephalic and atraumatic.  Right Ear: External ear normal.  Left Ear: External ear normal.  Nose: Nose normal.  Eyes: Conjunctivae are normal. Right eye exhibits no discharge. Left eye exhibits no discharge. No scleral icterus.  Neck: Neck supple. No thyromegaly present.  No carotid bruits bilaterally  Cardiovascular: Normal rate and regular rhythm.  No murmur heard. Pulmonary/Chest: Effort normal and breath sounds normal. No respiratory distress.  Musculoskeletal: Normal range of motion. She exhibits no edema.  Lymphadenopathy:    She has no cervical adenopathy.  Neurological: She is alert and oriented to person, place, and time.  Skin: Skin is warm and dry. Capillary refill takes less than 2 seconds. No rash noted. She is not diaphoretic.  Psychiatric: She has a normal mood and affect. Her behavior is normal. Judgment and thought content normal.  Nursing note reviewed.  Assessment And Plan:  1. Mixed hyperlipidemia- chronic. She is trying low carb diet to bring it down naturally.  - olmesartan-hydrochlorothiazide (BENICAR HCT) 20-12.5 MG tablet; Take 1 tablet by mouth daily.  Dispense: 90 tablet; Refill: 0  2. Vitamin D deficiency- past hx of this. For now can cantinue vit D 1000 IU per day.  - VITAMIN D 25 Hydroxy (Vit-D Deficiency, Fractures)  3. Abnormal glucose- chronic. She is aware she needs to do better with her diet.  - Hemoglobin A1C  4. Essential hypertension- stable. May continue current med. Fu in 3 months with Janece.  - CBC no Diff - CMP14 + Anion Gap  5. Class 1 obesity due to excess calories with serious comorbidity and body mass index (BMI) of  32.0 to 32.9 in adult- is aware she needs to be more compliant with her diet.      Asiel Chrostowski RODRIGUEZ-SOUTHWORTH, PA-C    THE PATIENT IS ENCOURAGED TO PRACTICE SOCIAL DISTANCING DUE TO THE COVID-19 PANDEMIC.

## 2019-10-28 LAB — CMP14 + ANION GAP
ALT: 22 IU/L (ref 0–32)
AST: 23 IU/L (ref 0–40)
Albumin/Globulin Ratio: 1.5 (ref 1.2–2.2)
Albumin: 4.3 g/dL (ref 3.8–4.9)
Alkaline Phosphatase: 56 IU/L (ref 48–121)
Anion Gap: 15 mmol/L (ref 10.0–18.0)
BUN/Creatinine Ratio: 22 (ref 9–23)
BUN: 14 mg/dL (ref 6–24)
Bilirubin Total: <0.2 mg/dL (ref 0.0–1.2)
CO2: 24 mmol/L (ref 20–29)
Calcium: 9.9 mg/dL (ref 8.7–10.2)
Chloride: 102 mmol/L (ref 96–106)
Creatinine, Ser: 0.65 mg/dL (ref 0.57–1.00)
GFR calc Af Amer: 119 mL/min/{1.73_m2} (ref >59)
GFR calc non Af Amer: 103 mL/min/{1.73_m2} (ref >59)
Globulin, Total: 2.8 g/dL (ref 1.5–4.5)
Glucose: 107 mg/dL — ABNORMAL HIGH (ref 65–99)
Potassium: 3.9 mmol/L (ref 3.5–5.2)
Sodium: 141 mmol/L (ref 134–144)
Total Protein: 7.1 g/dL (ref 6.0–8.5)

## 2019-10-28 LAB — CBC
Hematocrit: 35.9 % (ref 34.0–46.6)
Hemoglobin: 12.2 g/dL (ref 11.1–15.9)
MCH: 28.5 pg (ref 26.6–33.0)
MCHC: 34 g/dL (ref 31.5–35.7)
MCV: 84 fL (ref 79–97)
Platelets: 289 10*3/uL (ref 150–450)
RBC: 4.28 x10E6/uL (ref 3.77–5.28)
RDW: 13 % (ref 11.7–15.4)
WBC: 6.3 10*3/uL (ref 3.4–10.8)

## 2019-10-28 LAB — HEMOGLOBIN A1C
Est. average glucose Bld gHb Est-mCnc: 120 mg/dL
Hgb A1c MFr Bld: 5.8 % — ABNORMAL HIGH (ref 4.8–5.6)

## 2019-10-28 LAB — VITAMIN D 25 HYDROXY (VIT D DEFICIENCY, FRACTURES): Vit D, 25-Hydroxy: 34.5 ng/mL (ref 30.0–100.0)

## 2019-11-02 MED FILL — TAMOXIFEN 20 MG TABLET: 20 | 90 days supply | Qty: 90 | Fill #1

## 2019-11-03 ENCOUNTER — Ambulatory Visit: Payer: 59 | Admitting: Internal Medicine

## 2019-12-07 MED FILL — OLMESARTAN-HCTZ 20-12.5 MG: 20-12.5 | 90 days supply | Qty: 90 | Fill #0

## 2020-01-12 ENCOUNTER — Encounter: Payer: 59 | Admitting: Internal Medicine

## 2020-01-24 ENCOUNTER — Other Ambulatory Visit: Payer: Self-pay | Admitting: Nurse Practitioner

## 2020-01-24 ENCOUNTER — Other Ambulatory Visit: Payer: Self-pay

## 2020-01-24 ENCOUNTER — Encounter: Payer: Self-pay | Admitting: Nurse Practitioner

## 2020-01-24 ENCOUNTER — Ambulatory Visit: Payer: 59 | Admitting: Nurse Practitioner

## 2020-01-24 VITALS — BP 122/84 | HR 79 | Temp 98.5°F | Ht 64.2 in | Wt 189.2 lb

## 2020-01-24 DIAGNOSIS — Z853 Personal history of malignant neoplasm of breast: Secondary | ICD-10-CM | POA: Diagnosis not present

## 2020-01-24 DIAGNOSIS — E782 Mixed hyperlipidemia: Secondary | ICD-10-CM

## 2020-01-24 DIAGNOSIS — Z0000 Encounter for general adult medical examination without abnormal findings: Secondary | ICD-10-CM | POA: Diagnosis not present

## 2020-01-24 DIAGNOSIS — I10 Essential (primary) hypertension: Secondary | ICD-10-CM | POA: Diagnosis not present

## 2020-01-24 DIAGNOSIS — R7303 Prediabetes: Secondary | ICD-10-CM | POA: Diagnosis not present

## 2020-01-24 DIAGNOSIS — Z1159 Encounter for screening for other viral diseases: Secondary | ICD-10-CM

## 2020-01-24 LAB — POCT UA - MICROALBUMIN
Albumin/Creatinine Ratio, Urine, POC: 30
Creatinine, POC: 200 mg/dL
Microalbumin Ur, POC: 10 mg/L

## 2020-01-24 LAB — POCT URINALYSIS DIPSTICK
Bilirubin, UA: NEGATIVE
Blood, UA: NEGATIVE
Glucose, UA: NEGATIVE
Ketones, UA: NEGATIVE
Leukocytes, UA: NEGATIVE
Nitrite, UA: NEGATIVE
Protein, UA: NEGATIVE
Spec Grav, UA: 1.025 (ref 1.010–1.025)
Urobilinogen, UA: 0.2 E.U./dL
pH, UA: 5.5 (ref 5.0–8.0)

## 2020-01-24 MED ORDER — OLMESARTAN MEDOXOMIL-HCTZ 20-12.5 MG PO TABS: 1.0000 | ORAL_TABLET | Freq: Every day | ORAL | 1 refills | Status: DC

## 2020-01-24 NOTE — Patient Instructions (Addendum)
Health Maintenance, Female Adopting a healthy lifestyle and getting preventive care are important in promoting health and wellness. Ask your health care provider about:  The right schedule for you to have regular tests and exams.  Things you can do on your own to prevent diseases and keep yourself healthy. What should I know about diet, weight, and exercise? Eat a healthy diet   Eat a diet that includes plenty of vegetables, fruits, low-fat dairy products, and lean protein.  Do not eat a lot of foods that are high in solid fats, added sugars, or sodium. Maintain a healthy weight Body mass index (BMI) is used to identify weight problems. It estimates body fat based on height and weight. Your health care provider can help determine your BMI and help you achieve or maintain a healthy weight. Get regular exercise Get regular exercise. This is one of the most important things you can do for your health. Most adults should:  Exercise for at least 150 minutes each week. The exercise should increase your heart rate and make you sweat (moderate-intensity exercise).  Do strengthening exercises at least twice a week. This is in addition to the moderate-intensity exercise.  Spend less time sitting. Even light physical activity can be beneficial. Watch cholesterol and blood lipids Have your blood tested for lipids and cholesterol at 52 years of age, then have this test every 5 years. Have your cholesterol levels checked more often if:  Your lipid or cholesterol levels are high.  You are older than 52 years of age.  You are at high risk for heart disease. What should I know about cancer screening? Depending on your health history and family history, you may need to have cancer screening at various ages. This may include screening for:  Breast cancer.  Cervical cancer.  Colorectal cancer.  Skin cancer.  Lung cancer. What should I know about heart disease, diabetes, and high blood  pressure? Blood pressure and heart disease  High blood pressure causes heart disease and increases the risk of stroke. This is more likely to develop in people who have high blood pressure readings, are of African descent, or are overweight.  Have your blood pressure checked: ? Every 3-5 years if you are 18-39 years of age. ? Every year if you are 40 years old or older. Diabetes Have regular diabetes screenings. This checks your fasting blood sugar level. Have the screening done:  Once every three years after age 40 if you are at a normal weight and have a low risk for diabetes.  More often and at a younger age if you are overweight or have a high risk for diabetes. What should I know about preventing infection? Hepatitis B If you have a higher risk for hepatitis B, you should be screened for this virus. Talk with your health care provider to find out if you are at risk for hepatitis B infection. Hepatitis C Testing is recommended for:  Everyone born from 1945 through 1965.  Anyone with known risk factors for hepatitis C. Sexually transmitted infections (STIs)  Get screened for STIs, including gonorrhea and chlamydia, if: ? You are sexually active and are younger than 52 years of age. ? You are older than 52 years of age and your health care provider tells you that you are at risk for this type of infection. ? Your sexual activity has changed since you were last screened, and you are at increased risk for chlamydia or gonorrhea. Ask your health care provider if   you are at risk.  Ask your health care provider about whether you are at high risk for HIV. Your health care provider may recommend a prescription medicine to help prevent HIV infection. If you choose to take medicine to prevent HIV, you should first get tested for HIV. You should then be tested every 3 months for as long as you are taking the medicine. Pregnancy  If you are about to stop having your period (premenopausal) and  you may become pregnant, seek counseling before you get pregnant.  Take 400 to 800 micrograms (mcg) of folic acid every day if you become pregnant.  Ask for birth control (contraception) if you want to prevent pregnancy. Osteoporosis and menopause Osteoporosis is a disease in which the bones lose minerals and strength with aging. This can result in bone fractures. If you are 20 years old or older, or if you are at risk for osteoporosis and fractures, ask your health care provider if you should:  Be screened for bone loss.  Take a calcium or vitamin D supplement to lower your risk of fractures.  Be given hormone replacement therapy (HRT) to treat symptoms of menopause. Follow these instructions at home: Lifestyle  Do not use any products that contain nicotine or tobacco, such as cigarettes, e-cigarettes, and chewing tobacco. If you need help quitting, ask your health care provider.  Do not use street drugs.  Do not share needles.  Ask your health care provider for help if you need support or information about quitting drugs. Alcohol use  Do not drink alcohol if: ? Your health care provider tells you not to drink. ? You are pregnant, may be pregnant, or are planning to become pregnant.  If you drink alcohol: ? Limit how much you use to 0-1 drink a day. ? Limit intake if you are breastfeeding.  Be aware of how much alcohol is in your drink. In the [place] one drink equals one 12 oz bottle of beer (355 mL), one 5 oz glass of wine (148 mL), or one 1 oz glass of hard liquor (44 mL). General instructions  Schedule regular health, dental, and eye exams.  Stay current with your vaccines.  Tell your health care provider if: ? You often feel depressed. ? You have ever been abused or do not feel safe at home. Summary  Adopting a healthy lifestyle and getting preventive care are important in promoting health and wellness.  Follow your health care provider's instructions about healthy  diet, exercising, and getting tested or screened for diseases.  Follow your health care provider's instructions on monitoring your cholesterol and blood pressure. This information is not intended to replace advice given to you by your health care provider. Make sure you discuss any questions you have with your health care provider. Document Revised: 05/19/2018 Document Reviewed: 05/19/2018 Elsevier Patient Education  2020 [employer].   Exercising to Lose Weight Exercise is structured, repetitive physical activity to improve fitness and health. Getting regular exercise is important for everyone. It is especially important if you are overweight. Being overweight increases your risk of heart disease, stroke, diabetes, high blood pressure, and several types of cancer. Reducing your calorie intake and exercising can help you lose weight. Exercise is usually categorized as moderate or vigorous intensity. To lose weight, most people need to do a certain amount of moderate-intensity or vigorous-intensity exercise each week. Moderate-intensity exercise  Moderate-intensity exercise is any activity that gets you moving enough to burn at least three times more energy (  calories) than if you were sitting. Examples of moderate exercise include:  Walking a mile in 15 minutes.  Doing light yard work.  Biking at an easy pace. Most people should get at least 150 minutes (2 hours and 30 minutes) a week of moderate-intensity exercise to maintain their body weight. Vigorous-intensity exercise Vigorous-intensity exercise is any activity that gets you moving enough to burn at least six times more calories than if you were sitting. When you exercise at this intensity, you should be working hard enough that you are not able to carry on a conversation. Examples of vigorous exercise include:  Running.  Playing a team sport, such as football, basketball, and soccer.  Jumping rope. Most people should get at  least 75 minutes (1 hour and 15 minutes) a week of vigorous-intensity exercise to maintain their body weight. How can exercise affect me? When you exercise enough to burn more calories than you eat, you lose weight. Exercise also reduces body fat and builds muscle. The more muscle you have, the more calories you burn. Exercise also:  Improves mood.  Reduces stress and tension.  Improves your overall fitness, flexibility, and endurance.  Increases bone strength. The amount of exercise you need to lose weight depends on:  Your age.  The type of exercise.  Any health conditions you have.  Your overall physical ability. Talk to your health care provider about how much exercise you need and what types of activities are safe for you. What actions can I take to lose weight? Nutrition   Make changes to your diet as told by your health care provider or diet and nutrition specialist (dietitian). This may include: ? Eating fewer calories. ? Eating more protein. ? Eating less unhealthy fats. ? Eating a diet that includes fresh fruits and vegetables, whole grains, low-fat dairy products, and lean protein. ? Avoiding foods with added fat, salt, and sugar.  Drink plenty of water while you exercise to prevent dehydration or heat stroke. Activity  Choose an activity that you enjoy and set realistic goals. Your health care provider can help you make an exercise plan that works for you.  Exercise at a moderate or vigorous intensity most days of the week. ? The intensity of exercise may vary from person to person. You can tell how intense a workout is for you by paying attention to your breathing and heartbeat. Most people will notice their breathing and heartbeat get faster with more intense exercise.  Do resistance training twice each week, such as: ? Push-ups. ? Sit-ups. ? Lifting weights. ? Using resistance bands.  Getting short amounts of exercise can be just as helpful as long  structured periods of exercise. If you have trouble finding time to exercise, try to include exercise in your daily routine. ? Get up, stretch, and walk around every 30 minutes throughout the day. ? Go for a walk during your lunch break. ? Park your car farther away from your destination. ? If you take public transportation, get off one stop early and walk the rest of the way. ? Make phone calls while standing up and walking around. ? Take the stairs instead of elevators or escalators.  Wear comfortable clothes and shoes with good support.  Do not exercise so much that you hurt yourself, feel dizzy, or get very short of breath. Where to find more information  [place] Department of Health and Human Services: [employer]at  Centers for Disease Control and Prevention (CDC): http://www.wolf.info/ Contact a health care provider:  Before starting a new exercise program.  If you have questions or concerns about your weight.  If you have a medical problem that keeps you from exercising. Get help right away if you have any of the following while exercising:  Injury.  Dizziness.  Difficulty breathing or shortness of breath that does not go away when you stop exercising.  Chest pain.  Rapid heartbeat. Summary  Being overweight increases your risk of heart disease, stroke, diabetes, high blood pressure, and several types of cancer.  Losing weight happens when you burn more calories than you eat.  Reducing the amount of calories you eat in addition to getting regular moderate or vigorous exercise each week helps you lose weight. This information is not intended to replace advice given to you by your health care provider. Make sure you discuss any questions you have with your health care provider. Document Revised: 06/08/2017 Document Reviewed: 06/08/2017 Elsevier Patient Education  2020 [employer].   [email or web address].wegovy.[email or web address]

## 2020-01-24 NOTE — Progress Notes (Signed)
I,Yamilka Roman [employer] as a Education administrator for [employer], FNP.,have documented all relevant documentation on the behalf of Minette Brine, FNP,as directed by  Minette Brine, FNP while in the presence of Minette Brine, [place].  This visit occurred during the SARS-CoV-2 public health emergency.  Safety protocols were in place, including screening questions prior to the visit, additional usage of staff PPE, and extensive cleaning of exam room while observing appropriate contact time as indicated for disinfecting solutions.  Subjective:     Patient ID: Erin Bowers , female    DOB: 27-Jun-1965 , 54 y.o.   MRN: [id number]   Chief Complaint  Patient presents with  . Annual Exam    HPI  Here for HM    Past Medical History:  Diagnosis Date  . Blood transfusion without reported diagnosis     15 years ago  . Breast cancer ([place])   . Heart murmur   . Hypertension   . Personal history of radiation therapy 2017     Family History  Problem Relation Age of Onset  . Stroke Mother   . Hypertension Mother   . Stroke Father   . Colon cancer Neg Hx   . Esophageal cancer Neg Hx   . Rectal cancer Neg Hx   . Stomach cancer Neg Hx      Current Outpatient Medications:  .  Ascorbic Acid (VITAMIN C) 1000 MG tablet, Take 1,000 mg by mouth daily., Disp: , Rfl:  .  Cholecalciferol (D3-1000) 1000 units capsule, Take 1,000 Units by mouth daily., Disp: , Rfl:  .  Multiple Vitamin (MULTIVITAMIN) tablet, Take 1 tablet by mouth daily., Disp: , Rfl:  .  olmesartan-hydrochlorothiazide (BENICAR HCT) 20-12.5 MG tablet, Take 1 tablet by mouth daily., Disp: 90 tablet, Rfl: 1 .  Omega-3 Fatty Acids (FISH OIL) 1000 MG CAPS, Take by mouth., Disp: , Rfl:  .  tamoxifen (NOLVADEX) 20 MG tablet, Take 1 tablet (20 mg total) by mouth daily., Disp: 90 tablet, Rfl: 3   Allergies  Allergen Reactions  . Adhesive [Tape] Other (See Comments)    Steri-strips cause blisters       The patient states she is status post  hysterectomy  Negative for Dysmenorrhea and Negative for Menorrhagia. Negative for: breast discharge, breast lump(s), breast pain and breast self exam. Associated symptoms include abnormal vaginal bleeding. Pertinent negatives include abnormal bleeding (hematology), anxiety, decreased libido, depression, difficulty falling sleep, dyspareunia, history of infertility, nocturia, sexual dysfunction, sleep disturbances, urinary incontinence, urinary urgency, vaginal discharge and vaginal itching. Diet regular; low sodium. The patient states her exercise level is minimal - just started back. She is strength training 3 days a week and plans to start cardio soon.  Goal to lose at least 30 lbs in 2-3 years.  She does not buy snacks at home. She eats mostly meat, skip breakfast and sometimes skip lunch then when gets home eats more.  She does report sleeping at least 8 hours per night.  She tried a medication for her weight in her 20's which caused her heart rate to increase.   The patient's tobacco use is:  Social History   Tobacco Use  Smoking Status Never Smoker  Smokeless Tobacco Never Used   She has been exposed to passive smoke. The patient's alcohol use is:  Social History   Substance and Sexual Activity  Alcohol Use No   Additional information: she had been seeing Dr. Dellis Filbert had PAP last year advised since had a hysterectomy does not need  any longer unless there is an issue.  Review of Systems  Constitutional: Negative.   HENT: Negative.   Eyes: Negative.   Respiratory: Negative.   Cardiovascular: Negative.   Gastrointestinal: Negative.   Endocrine: Negative.   Genitourinary: Negative.   Musculoskeletal: Negative.   Skin: Negative.   Allergic/Immunologic: Negative.   Neurological: Negative.   Hematological: Negative.   Psychiatric/Behavioral: Negative.      Today's Vitals   01/24/20 0901  BP: 122/84  Pulse: 79  Temp: 98.5 F (36.9 C)  TempSrc: Oral  Weight: 189 lb 3.2 oz (85.8  kg)  Height: 5' 4.2" (1.631 m)  PainSc: 0-No pain   Body mass index is 32.27 kg/m.   Objective:  Physical Exam Constitutional:      General: She is not in acute distress.    Appearance: Normal appearance. She is well-developed. She is obese.  HENT:     Head: Normocephalic and atraumatic.     Right Ear: Hearing, tympanic membrane, ear canal and external ear normal. There is no impacted cerumen.     Left Ear: Hearing, tympanic membrane, ear canal and external ear normal. There is no impacted cerumen.     Nose: Nose normal.     Mouth/Throat:     Mouth: Mucous membranes are dry.  Eyes:     General: Lids are normal.     Extraocular Movements: Extraocular movements intact.     Conjunctiva/sclera: Conjunctivae normal.     Pupils: Pupils are equal, round, and reactive to light.     Funduscopic exam:    Right eye: No papilledema.        Left eye: No papilledema.  Neck:     Thyroid: No thyroid mass.     Vascular: No carotid bruit.  Cardiovascular:     Rate and Rhythm: Normal rate and regular rhythm.     Pulses: Normal pulses.     Heart sounds: Normal heart sounds. No murmur heard.   Pulmonary:     Effort: Pulmonary effort is normal.     Breath sounds: Normal breath sounds.  Chest:     Chest wall: No mass.     Breasts: Tanner Score is 5.        Right: Normal. No mass or tenderness.        Left: Normal. No mass or tenderness.  Abdominal:     General: Abdomen is flat. Bowel sounds are normal. There is no distension.     Palpations: Abdomen is soft.     Tenderness: There is no abdominal tenderness.  Genitourinary:    Rectum: Guaiac result negative.  Musculoskeletal:        General: No swelling. Normal range of motion.     Cervical back: Full passive range of motion without pain, normal range of motion and neck supple.     Right lower leg: No edema.     Left lower leg: No edema.  Skin:    General: Skin is warm and dry.     Capillary Refill: Capillary refill takes less than 2  seconds.  Neurological:     General: No focal deficit present.     Mental Status: She is alert and oriented to person, place, and time.     Cranial Nerves: No cranial nerve deficit.     Sensory: No sensory deficit.  Psychiatric:        Mood and Affect: Mood normal.        Behavior: Behavior normal.  Thought Content: Thought content normal.        Judgment: Judgment normal.         Assessment And Plan:     1. Encounter for general adult medical examination w/o abnormal findings . Behavior modifications discussed and diet history reviewed.   . Pt will continue to exercise regularly and modify diet with low GI, plant based foods and decrease intake of processed foods.  . Recommend intake of daily multivitamin, Vitamin D, and calcium.  . Recommend for preventive screenings, as well as recommend immunizations that include influenza, TDAP (up to date) - CMP14+EGFR - CBC  2. Encounter for hepatitis C screening test for low risk patient  Will check Hepatitis C screening due to recent recommendations to screen all adults 18 years and older - Hepatitis C antibody  3. Essential hypertension . B/P is controlled.  . CMP ordered to check renal function.  . The importance of regular exercise and dietary modification was stressed to the patient.  . Stressed importance of losing ten percent of her body weight to help with B/P control.  . The weight loss would help with decreasing cardiac and cancer risk as well.  . EKG done with NSR HR 69 - POCT Urinalysis Dipstick (81002) - POCT UA - Microalbumin - EKG 12-Lead  4. Prediabetes  Chronic, controlled  Continue with current medications  Encouraged to limit intake of sugary foods and drinks  Encouraged to increase physical activity to 150 minutes per week as tolerated she is to  - Hemoglobin A1c  5. Mixed hyperlipidemia - Lipid panel - olmesartan-hydrochlorothiazide (BENICAR HCT) 20-12.5 MG tablet; Take 1 tablet by mouth daily.   Dispense: 90 tablet; Refill: 1  6. History of breast cancer  Continue follow up with Dr. Lindi Adie  Continue with Tamoxifen as per order from Dr. Lindi Adie    Patient was given opportunity to ask questions. Patient verbalized understanding of the plan and was able to repeat key elements of the plan. All questions were answered to their satisfaction.   Minette Brine, FNP   I, Minette Brine, FNP, have reviewed all documentation for this visit. The documentation on 01/24/20 for the exam, diagnosis, procedures, and orders are all accurate and complete.  THE PATIENT IS ENCOURAGED TO PRACTICE SOCIAL DISTANCING DUE TO THE COVID-19 PANDEMIC.

## 2020-01-25 LAB — CMP14+EGFR
ALT: 19 IU/L (ref 0–32)
AST: 23 IU/L (ref 0–40)
Albumin/Globulin Ratio: 1.6 (ref 1.2–2.2)
Albumin: 4.4 g/dL (ref 3.8–4.9)
Alkaline Phosphatase: 59 IU/L (ref 48–121)
BUN/Creatinine Ratio: 18 (ref 9–23)
BUN: 13 mg/dL (ref 6–24)
Bilirubin Total: 0.3 mg/dL (ref 0.0–1.2)
CO2: 24 mmol/L (ref 20–29)
Calcium: 10.6 mg/dL — ABNORMAL HIGH (ref 8.7–10.2)
Chloride: 101 mmol/L (ref 96–106)
Creatinine, Ser: 0.71 mg/dL (ref 0.57–1.00)
GFR calc Af Amer: 114 mL/min/{1.73_m2} (ref >59)
GFR calc non Af Amer: 99 mL/min/{1.73_m2} (ref >59)
Globulin, Total: 2.7 g/dL (ref 1.5–4.5)
Glucose: 80 mg/dL (ref 65–99)
Potassium: 4.2 mmol/L (ref 3.5–5.2)
Sodium: 140 mmol/L (ref 134–144)
Total Protein: 7.1 g/dL (ref 6.0–8.5)

## 2020-01-25 LAB — CBC
Hematocrit: 39.1 % (ref 34.0–46.6)
Hemoglobin: 12.8 g/dL (ref 11.1–15.9)
MCH: 27.5 pg (ref 26.6–33.0)
MCHC: 32.7 g/dL (ref 31.5–35.7)
MCV: 84 fL (ref 79–97)
Platelets: 298 10*3/uL (ref 150–450)
RBC: 4.65 x10E6/uL (ref 3.77–5.28)
RDW: 13.7 % (ref 11.7–15.4)
WBC: 6 10*3/uL (ref 3.4–10.8)

## 2020-01-25 LAB — LIPID PANEL
Chol/HDL Ratio: 3.3 ratio (ref 0.0–4.4)
Cholesterol, Total: 214 mg/dL — ABNORMAL HIGH (ref 100–199)
HDL: 65 mg/dL (ref >39)
LDL Chol Calc (NIH): 132 mg/dL — ABNORMAL HIGH (ref 0–99)
Triglycerides: 95 mg/dL (ref 0–149)
VLDL Cholesterol Cal: 17 mg/dL (ref 5–40)

## 2020-01-25 LAB — HEMOGLOBIN A1C
Est. average glucose Bld gHb Est-mCnc: 126 mg/dL
Hgb A1c MFr Bld: 6 % — ABNORMAL HIGH (ref 4.8–5.6)

## 2020-01-25 LAB — HEPATITIS C ANTIBODY: Hep C Virus Ab: 0.2 s/co ratio (ref 0.0–0.9)

## 2020-02-06 MED FILL — TAMOXIFEN 20 MG TABLET: 20 | 90 days supply | Qty: 90 | Fill #2

## 2020-03-05 MED FILL — OLMESARTAN-HCTZ 20-12.5 MG: 20-12.5 | 90 days supply | Qty: 90 | Fill #0

## 2020-04-24 ENCOUNTER — Other Ambulatory Visit: Payer: Self-pay | Admitting: Internal Medicine

## 2020-04-24 DIAGNOSIS — Z1231 Encounter for screening mammogram for malignant neoplasm of breast: Secondary | ICD-10-CM

## 2020-05-01 MED FILL — TAMOXIFEN 20 MG TABLET: 20 | 90 days supply | Qty: 90 | Fill #3

## 2020-06-05 ENCOUNTER — Ambulatory Visit
Admission: RE | Admit: 2020-06-05 | Discharge: 2020-06-05 | Disposition: A | Discharge status: Home / Self Care | Payer: 59 | Source: Ambulatory Visit | Attending: Internal Medicine | Admitting: Internal Medicine

## 2020-06-05 ENCOUNTER — Other Ambulatory Visit: Payer: Self-pay

## 2020-06-05 DIAGNOSIS — Z1231 Encounter for screening mammogram for malignant neoplasm of breast: Secondary | ICD-10-CM

## 2020-06-05 MED FILL — OLMESARTAN-HCTZ 20-12.5 MG: 20-12.5 | 90 days supply | Qty: 90 | Fill #1

## 2020-06-06 ENCOUNTER — Other Ambulatory Visit: Payer: Self-pay | Admitting: Internal Medicine

## 2020-06-06 DIAGNOSIS — Z9889 Other specified postprocedural states: Secondary | ICD-10-CM

## 2020-07-03 NOTE — Progress Notes (Signed)
Patient Care Team: Minette Brine, FNP as PCP - General (General Practice) Jovita Kussmaul, MD as Consulting Physician (General Surgery) Nicholas Lose, MD as Consulting Physician (Hematology and Oncology) Thea Silversmith, MD as Consulting Physician (Radiation Oncology) Sylvan Cheese, NP as Nurse Practitioner (Hematology and Oncology)  DIAGNOSIS:    ICD-10-CM   1. Malignant neoplasm of upper-inner quadrant of left breast in female, estrogen receptor positive ([place])  C50.212 tamoxifen (NOLVADEX) 20 MG tablet   Z17.0     SUMMARY OF ONCOLOGIC HISTORY: Oncology History  Breast cancer of upper-inner quadrant of left female breast ([place])  06/25/2015 Mammogram   Screening detected left breast calcifications posterior depth 4.4 x 3.3 x 2.3 cm   06/27/2015 Initial Diagnosis   Left breast biopsy upper quadrant: DCIS with comedo necrosis and calcification, grade 2-3, ER 100%, PR 15%, Tis N0 stage 0 clinical stage   08/03/2015 Surgery   Left lumpectomy Marlou Starks): Intermediate to high-grade DCIS with comedonecrosis, DCIS close superior margin, excision left superior margin DCIS less than 0.1 cm, additional superior margin still showed DCIS focally positive, ER 100%, PR 15%   08/17/2015 Surgery   Left superior margin excision: Less than 0.2 cm residual high-grade DCIS; left additional superior/medial margin: Benign   09/26/2015 - 10/24/2015 Radiation Therapy   Adjuvant radiation therapy [name]). Left breast/ 42.72 Gy at 2.67 Gy per fraction x 16 fractions. 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 with above-mentioned history of left breast DCIS who is currently on tamoxifen therapy.She presents to the clinic today for follow-up.  Other than hot flashes she is tolerating  tamoxifen extremely well.  Denies any lumps or nodules in the breast.  ALLERGIES:  is allergic to adhesive [tape].  MEDICATIONS:  Current Outpatient Medications  Medication Sig Dispense Refill  . Ascorbic Acid (VITAMIN C) 1000 MG tablet Take 1,000 mg by mouth daily.    . Multiple Vitamin (MULTIVITAMIN) tablet Take 1 tablet by mouth daily.    Marland Kitchen olmesartan-hydrochlorothiazide (BENICAR HCT) 20-12.5 MG tablet Take 1 tablet by mouth daily. 90 tablet 1  . Omega-3 Fatty Acids (FISH OIL) 1000 MG CAPS Take by mouth.    . tamoxifen (NOLVADEX) 20 MG tablet Take 1 tablet (20 mg total) by mouth daily. 90 tablet 1   No current facility-administered medications for this visit.    PHYSICAL EXAMINATION: ECOG PERFORMANCE STATUS: 1 - Symptomatic but completely ambulatory  Vitals:   07/04/20 0927  BP: (!) 142/74  Pulse: 78  Resp: 17  Temp: (!) 97.3 F (36.3 C)  SpO2: 100%   Filed Weights   07/04/20 0927  Weight: 188 lb 4.8 oz (85.4 kg)    BREAST: No palpable masses or nodules in either right or left breasts. No palpable axillary supraclavicular or infraclavicular adenopathy no breast tenderness or nipple discharge. (exam performed in the presence of a chaperone)  LABORATORY DATA:  I have reviewed the data as listed CMP Latest Ref Rng & Units 01/24/2020 10/27/2019 08/03/2019  Glucose 65 - 99 mg/dL 80 107(H) 80  BUN 6 - 24 mg/dL 13 14 13   Creatinine 0.57 - 1.00 mg/dL 0.71 0.65 0.67  Sodium 134 - 144 mmol/L 140 141 140  Potassium 3.5 - 5.2 mmol/L 4.2 3.9 4.2  Chloride 96 - 106 mmol/L 101 102 102  CO2 20 - 29 mmol/L 24 24 22   Calcium 8.7 - 10.2 mg/dL 10.6(H) 9.9 9.8  Total Protein 6.0 - 8.5 g/dL 7.1 7.1 7.2  Total Bilirubin 0.0 - 1.2 mg/dL 0.3 <0.2 <0.2  Alkaline Phos 48 - 121 IU/L 59 56 54  AST 0 - 40 IU/L 23 23 16   ALT 0 - 32 IU/L 19 22 16     Lab Results  Component Value Date   WBC 6.0 01/24/2020   HGB 12.8 01/24/2020   HCT 39.1 01/24/2020   MCV 84 01/24/2020   PLT 298 01/24/2020    NEUTROABS 3.5 04/14/2019    ASSESSMENT & PLAN:  Breast cancer of upper-inner quadrant of left female breast ([place]) Left lumpectomy 08/03/2015 : Intermediate to high-grade DCIS with comedonecrosis, DCIS close superior margin, excision left superior margin DCIS less than 0.1 cm, additional superior margin still showed DCIS focally positive, ER 100%, PR 15% Reexcision of margins 08/17/2015: 0.2 cm residual DCIS but final margins negative Pathologic stage: Tis N0 stage 0 Adjuvant radiation therapy from 09/26/2015 to 10/24/2015  Current treatment: Tamoxifen 20 mg daily 5 years started 11/23/2015  Tamoxifen toxicities: 1. Hot flashesmild Denies any musculoskeletal aches and pains.  Breast Cancer Surveillance: 1. Breast exam1/26/2022: Benign 2. Mammogramscheduled for 07/13/2020  Return to clinic on an as-needed basis  No orders of the defined types were placed in this encounter.  The patient has a good understanding of the overall plan. she agrees with it. she will call with any problems that may develop before the next visit here.  Total time spent: 20 mins including face to face time and time spent for planning, charting and coordination of care  Nicholas Lose, MD 07/04/2020  I, Cloyde Reams Dorshimer, am acting as scribe for Dr. Nicholas Lose.  I have reviewed the above documentation for accuracy and completeness, and I agree with the above.

## 2020-07-03 NOTE — Assessment & Plan Note (Addendum)
Left lumpectomy 08/03/2015 : Intermediate to high-grade DCIS with comedonecrosis, DCIS close superior margin, excision left superior margin DCIS less than 0.1 cm, additional superior margin still showed DCIS focally positive, ER 100%, PR 15% Reexcision of margins 08/17/2015: 0.2 cm residual DCIS but final margins negative Pathologic stage: Tis N0 stage 0 Adjuvant radiation therapy from 09/26/2015 to 10/24/2015  Current treatment: Tamoxifen 20 mg daily 5 years started 11/23/2015  Tamoxifen toxicities: 1. Hot flashesmild Denies any musculoskeletal aches and pains.  Breast Cancer Surveillance: 1. Breast exam1/26/2022: Benign 2. Mammogramscheduled for 07/13/2020  Return to clinic on an as-needed basis

## 2020-07-04 ENCOUNTER — Other Ambulatory Visit: Payer: Self-pay | Admitting: Hematology and Oncology

## 2020-07-04 ENCOUNTER — Inpatient Hospital Stay: Payer: 59 | Attending: Hematology and Oncology | Admitting: Hematology and Oncology

## 2020-07-04 ENCOUNTER — Other Ambulatory Visit: Payer: Self-pay

## 2020-07-04 DIAGNOSIS — Z79899 Other long term (current) drug therapy: Secondary | ICD-10-CM | POA: Insufficient documentation

## 2020-07-04 DIAGNOSIS — Z923 Personal history of irradiation: Secondary | ICD-10-CM | POA: Insufficient documentation

## 2020-07-04 DIAGNOSIS — Z170 Estrogen receptor positive status [ER+]: Secondary | ICD-10-CM | POA: Diagnosis not present

## 2020-07-04 DIAGNOSIS — Z79810 Long term (current) use of selective estrogen receptor modulators (SERMs): Secondary | ICD-10-CM | POA: Insufficient documentation

## 2020-07-04 DIAGNOSIS — C50212 Malignant neoplasm of upper-inner quadrant of left female breast: Secondary | ICD-10-CM | POA: Insufficient documentation

## 2020-07-04 DIAGNOSIS — R232 Flushing: Secondary | ICD-10-CM | POA: Diagnosis not present

## 2020-07-04 MED ORDER — TAMOXIFEN CITRATE 20 MG PO TABS: 20.0000 mg | ORAL_TABLET | Freq: Every day | ORAL | 1 refills | Status: DC

## 2020-07-13 ENCOUNTER — Ambulatory Visit
Admission: RE | Admit: 2020-07-13 | Discharge: 2020-07-13 | Disposition: A | Discharge status: Home / Self Care | Payer: 59 | Source: Ambulatory Visit | Attending: Internal Medicine | Admitting: Internal Medicine

## 2020-07-13 ENCOUNTER — Other Ambulatory Visit: Payer: Self-pay

## 2020-07-13 DIAGNOSIS — R928 Other abnormal and inconclusive findings on diagnostic imaging of breast: Secondary | ICD-10-CM | POA: Diagnosis not present

## 2020-07-13 DIAGNOSIS — Z9889 Other specified postprocedural states: Secondary | ICD-10-CM

## 2020-07-26 ENCOUNTER — Other Ambulatory Visit: Payer: Self-pay | Admitting: Nurse Practitioner

## 2020-07-26 ENCOUNTER — Other Ambulatory Visit: Payer: Self-pay

## 2020-07-26 ENCOUNTER — Encounter: Payer: Self-pay | Admitting: Nurse Practitioner

## 2020-07-26 ENCOUNTER — Ambulatory Visit: Payer: 59 | Admitting: Nurse Practitioner

## 2020-07-26 VITALS — BP 122/76 | HR 87 | Temp 98.5°F | Ht 64.2 in | Wt 188.0 lb

## 2020-07-26 DIAGNOSIS — Z6832 Body mass index (BMI) 32.0-32.9, adult: Secondary | ICD-10-CM | POA: Diagnosis not present

## 2020-07-26 DIAGNOSIS — I10 Essential (primary) hypertension: Secondary | ICD-10-CM

## 2020-07-26 DIAGNOSIS — E782 Mixed hyperlipidemia: Secondary | ICD-10-CM | POA: Diagnosis not present

## 2020-07-26 DIAGNOSIS — R7303 Prediabetes: Secondary | ICD-10-CM | POA: Diagnosis not present

## 2020-07-26 DIAGNOSIS — E6609 Other obesity due to excess calories: Secondary | ICD-10-CM | POA: Diagnosis not present

## 2020-07-26 MED ORDER — OLMESARTAN MEDOXOMIL-HCTZ 20-12.5 MG PO TABS: 1.0000 | ORAL_TABLET | Freq: Every day | ORAL | 1 refills | Status: DC

## 2020-07-26 NOTE — Patient Instructions (Signed)

## 2020-07-26 NOTE — Progress Notes (Signed)
Rutherford Nail as a scribe for Minette Brine, FNP.,have documented all relevant documentation on the behalf of Minette Brine, FNP,as directed by  Minette Brine, FNP while in the presence of Minette Brine, [place]. This visit occurred during the SARS-CoV-2 public health emergency.  Safety protocols were in place, including screening questions prior to the visit, additional usage of staff PPE, and extensive cleaning of exam room while observing appropriate contact time as indicated for disinfecting solutions.  Subjective:     Patient ID: Erin Bowers , female    DOB: 10-Oct-1965 , 55 y.o.   MRN: [id number]   Chief Complaint  Patient presents with  . Hypertension    HPI  Here for HM  Wt Readings from Last 3 Encounters: 07/26/20 : 188 lb (85.3 kg) 07/04/20 : 188 lb 4.8 oz (85.4 kg) 01/24/20 : 189 lb 3.2 oz (85.8 kg)   Hypertension Pertinent negatives include no headaches.     Past Medical History:  Diagnosis Date  . Blood transfusion without reported diagnosis     15 years ago  . Breast cancer ([place])   . Heart murmur   . Hypertension   . Personal history of radiation therapy 2017     Family History  Problem Relation Age of Onset  . Stroke Mother   . Hypertension Mother   . Stroke Father   . Colon cancer Neg Hx   . Esophageal cancer Neg Hx   . Rectal cancer Neg Hx   . Stomach cancer Neg Hx      Current Outpatient Medications:  .  Ascorbic Acid (VITAMIN C) 1000 MG tablet, Take 1,000 mg by mouth daily., Disp: , Rfl:  .  Multiple Vitamin (MULTIVITAMIN) tablet, Take 1 tablet by mouth daily., Disp: , Rfl:  .  Omega-3 Fatty Acids (FISH OIL) 1000 MG CAPS, Take by mouth., Disp: , Rfl:  .  olmesartan-hydrochlorothiazide (BENICAR HCT) 20-12.5 MG tablet, Take 1 tablet by mouth daily., Disp: 90 tablet, Rfl: 1 .  tamoxifen (NOLVADEX) 20 MG tablet, TAKE 1 TABLET (20 MG TOTAL) BY MOUTH DAILY., Disp: 90 tablet, Rfl: 3   Allergies  Allergen Reactions  . Adhesive [Tape] Other (See  Comments)    Steri-strips cause blisters      Review of Systems  Constitutional: Negative.  Negative for fatigue.  HENT: Negative.   Respiratory: Negative.   Endocrine: Negative for polydipsia, polyphagia and polyuria.  Musculoskeletal: Negative.   Skin: Negative.   Neurological: Negative for dizziness and headaches.  Psychiatric/Behavioral: Negative.      Today's Vitals   07/26/20 1618  BP: 122/76  Pulse: 87  Temp: 98.5 F (36.9 C)  TempSrc: Oral  Weight: 188 lb (85.3 kg)  Height: 5' 4.2" (1.631 m)   Body mass index is 32.07 kg/m.  Wt Readings from Last 3 Encounters:  07/26/20 188 lb (85.3 kg)  07/04/20 188 lb 4.8 oz (85.4 kg)  01/24/20 189 lb 3.2 oz (85.8 kg)   Objective:  Physical Exam Vitals reviewed.  Constitutional:      General: She is not in acute distress.    Appearance: Normal appearance. She is obese.  HENT:     Head: Normocephalic.     Right Ear: Tympanic membrane, ear canal and external ear normal. There is no impacted cerumen.     Left Ear: Tympanic membrane, ear canal and external ear normal. There is no impacted cerumen.     Nose: Nose normal. No congestion.     Mouth/Throat:  Mouth: Mucous membranes are moist.  Eyes:     Extraocular Movements: Extraocular movements intact.     Conjunctiva/sclera: Conjunctivae normal.     Pupils: Pupils are equal, round, and reactive to light.  Cardiovascular:     Rate and Rhythm: Normal rate and regular rhythm.     Pulses: Normal pulses.     Heart sounds: Normal heart sounds. No murmur heard.   Pulmonary:     Effort: Pulmonary effort is normal. No respiratory distress.     Breath sounds: Normal breath sounds. No wheezing.  Skin:    General: Skin is warm and dry.     Capillary Refill: Capillary refill takes less than 2 seconds.  Neurological:     General: No focal deficit present.     Mental Status: She is alert and oriented to person, place, and time.     Cranial Nerves: No cranial nerve deficit.   Psychiatric:        Mood and Affect: Mood normal.        Behavior: Behavior normal.        Thought Content: Thought content normal.        Judgment: Judgment normal.         Assessment And Plan:     1. Essential hypertension  Chronic, well controlled  Continue with current medications - CMP14+EGFR  2. Class 1 obesity due to excess calories with serious comorbidity and body mass index (BMI) of 32.0 to 32.9 in adult Chronic Discussed healthy diet and regular exercise options  Encouraged to exercise at least 150 minutes per week with 2 days of strength training  3. Mixed hyperlipidemia  Chronic, controlled  No current medications  She is trying to focus on her diet - olmesartan-hydrochlorothiazide (BENICAR HCT) 20-12.5 MG tablet; Take 1 tablet by mouth daily.  Dispense: 90 tablet; Refill: 1 - Lipid panel   4. Prediabetes Chronic, controlled  Continue with current medications  Encouraged to limit intake of sugary foods and drinks  Encouraged to increase physical activity to 150 minutes per week as tolerated - Hemoglobin A1c     Patient was given opportunity to ask questions. Patient verbalized understanding of the plan and was able to repeat key elements of the plan. All questions were answered to their satisfaction.  Minette Brine, FNP   I, Minette Brine, FNP, have reviewed all documentation for this visit. The documentation on 08/12/20 for the exam, diagnosis, procedures, and orders are all accurate and complete.  THE PATIENT IS ENCOURAGED TO PRACTICE SOCIAL DISTANCING DUE TO THE COVID-19 PANDEMIC.

## 2020-07-27 LAB — LIPID PANEL
Chol/HDL Ratio: 3.3 ratio (ref 0.0–4.4)
Cholesterol, Total: 214 mg/dL — ABNORMAL HIGH (ref 100–199)
HDL: 65 mg/dL (ref >39)
LDL Chol Calc (NIH): 136 mg/dL — ABNORMAL HIGH (ref 0–99)
Triglycerides: 74 mg/dL (ref 0–149)
VLDL Cholesterol Cal: 13 mg/dL (ref 5–40)

## 2020-07-27 LAB — CMP14+EGFR
ALT: 47 IU/L — ABNORMAL HIGH (ref 0–32)
AST: 29 IU/L (ref 0–40)
Albumin/Globulin Ratio: 1.8 (ref 1.2–2.2)
Albumin: 4.6 g/dL (ref 3.8–4.9)
Alkaline Phosphatase: 54 IU/L (ref 44–121)
BUN/Creatinine Ratio: 21 (ref 9–23)
BUN: 14 mg/dL (ref 6–24)
Bilirubin Total: <0.2 mg/dL (ref 0.0–1.2)
CO2: 23 mmol/L (ref 20–29)
Calcium: 9.7 mg/dL (ref 8.7–10.2)
Chloride: 104 mmol/L (ref 96–106)
Creatinine, Ser: 0.68 mg/dL (ref 0.57–1.00)
GFR calc Af Amer: 116 mL/min/{1.73_m2} (ref >59)
GFR calc non Af Amer: 101 mL/min/{1.73_m2} (ref >59)
Globulin, Total: 2.5 g/dL (ref 1.5–4.5)
Glucose: 88 mg/dL (ref 65–99)
Potassium: 4.1 mmol/L (ref 3.5–5.2)
Sodium: 140 mmol/L (ref 134–144)
Total Protein: 7.1 g/dL (ref 6.0–8.5)

## 2020-07-27 LAB — HEMOGLOBIN A1C
Est. average glucose Bld gHb Est-mCnc: 114 mg/dL
Hgb A1c MFr Bld: 5.6 % (ref 4.8–5.6)

## 2020-07-30 ENCOUNTER — Other Ambulatory Visit: Payer: Self-pay | Admitting: Hematology and Oncology

## 2020-07-30 DIAGNOSIS — C50212 Malignant neoplasm of upper-inner quadrant of left female breast: Secondary | ICD-10-CM

## 2020-07-30 MED FILL — TAMOXIFEN 20 MG TABLET: 20 | 90 days supply | Qty: 90 | Fill #0

## 2020-09-05 MED FILL — OLMESARTAN-HCTZ 20-12.5 MG: 20-12.5 | 90 days supply | Qty: 90 | Fill #0

## 2020-10-23 ENCOUNTER — Ambulatory Visit: Payer: 59 | Admitting: Nurse Practitioner

## 2020-10-23 ENCOUNTER — Encounter: Payer: Self-pay | Admitting: Nurse Practitioner

## 2020-10-23 ENCOUNTER — Other Ambulatory Visit: Payer: Self-pay

## 2020-10-23 VITALS — BP 132/80 | HR 58 | Temp 98.4°F | Ht 64.0 in | Wt 182.8 lb

## 2020-10-23 DIAGNOSIS — Z6832 Body mass index (BMI) 32.0-32.9, adult: Secondary | ICD-10-CM

## 2020-10-23 DIAGNOSIS — R945 Abnormal results of liver function studies: Secondary | ICD-10-CM | POA: Diagnosis not present

## 2020-10-23 DIAGNOSIS — I10 Essential (primary) hypertension: Secondary | ICD-10-CM | POA: Diagnosis not present

## 2020-10-23 DIAGNOSIS — E782 Mixed hyperlipidemia: Secondary | ICD-10-CM

## 2020-10-23 DIAGNOSIS — E6609 Other obesity due to excess calories: Secondary | ICD-10-CM

## 2020-10-23 DIAGNOSIS — R7303 Prediabetes: Secondary | ICD-10-CM | POA: Diagnosis not present

## 2020-10-23 NOTE — Progress Notes (Signed)
I,Deaira Leckey,acting as a Education administrator for Minette Brine, FNP.,have documented all relevant documentation on the behalf of Minette Brine, FNP,as directed by  Minette Brine, FNP while in the presence of Minette Brine, [place].  This visit occurred during the SARS-CoV-2 public health emergency.  Safety protocols were in place, including screening questions prior to the visit, additional usage of staff PPE, and extensive cleaning of exam room while observing appropriate contact time as indicated for disinfecting solutions.  Subjective:     Patient ID: Erin Bowers , female    DOB: Sep 17, 1965 , 55 y.o.   MRN: [id number]   Chief Complaint  Patient presents with  . Diabetes  . Hypertension    HPI  Here for HM  Wt Readings from Last 3 Encounters: 10/23/20 : 182 lb 12.8 oz (82.9 kg) 07/26/20 : 188 lb (85.3 kg) 07/04/20 : 188 lb 4.8 oz (85.4 kg)   She rides her bike ane does strength training.  She is taking red yeast rice supplement for a couple weeks.    Hypertension This is a chronic problem. The current episode started more than 1 year ago. The problem is unchanged. The problem is controlled. Pertinent negatives include no anxiety, chest pain, headaches or palpitations. There are no associated agents to hypertension. Risk factors for coronary artery disease include obesity. Past treatments include diuretics and angiotensin blockers. The current treatment provides significant improvement. There are no compliance problems.  There is no history of angina. There is no history of chronic renal disease.     Past Medical History:  Diagnosis Date  . Blood transfusion without reported diagnosis     15 years ago  . Breast cancer ([place])   . Heart murmur   . Hypertension   . Personal history of radiation therapy 2017     Family History  Problem Relation Age of Onset  . Stroke Mother   . Hypertension Mother   . Stroke Father   . Colon cancer Neg Hx   . Esophageal cancer Neg Hx   . Rectal cancer Neg Hx    . Stomach cancer Neg Hx      Current Outpatient Medications:  .  Ascorbic Acid (VITAMIN C) 1000 MG tablet, Take 1,000 mg by mouth daily., Disp: , Rfl:  .  Multiple Vitamin (MULTIVITAMIN) tablet, Take 1 tablet by mouth daily., Disp: , Rfl:  .  olmesartan-hydrochlorothiazide (BENICAR HCT) 20-12.5 MG tablet, TAKE 1 TABLET BY MOUTH ONCE DAILY., Disp: 90 tablet, Rfl: 1 .  Omega-3 Fatty Acids (FISH OIL) 1000 MG CAPS, Take by mouth., Disp: , Rfl:  .  tamoxifen (NOLVADEX) 20 MG tablet, TAKE 1 TABLET (20 MG TOTAL) BY MOUTH DAILY., Disp: 90 tablet, Rfl: 3   Allergies  Allergen Reactions  . Adhesive [Tape] Other (See Comments)    Steri-strips cause blisters      Review of Systems  Constitutional: Negative.  Negative for fatigue.  Respiratory: Negative.   Cardiovascular: Negative.  Negative for chest pain, palpitations and leg swelling.  Neurological: Negative for dizziness and headaches.  Psychiatric/Behavioral: Negative.      Today's Vitals   10/23/20 1023  BP: 132/80  Pulse: (!) 58  Temp: 98.4 F (36.9 C)  Weight: 182 lb 12.8 oz (82.9 kg)  Height: 5' 4"  (1.626 m)  PainSc: 0-No pain   Body mass index is 31.38 kg/m.  Wt Readings from Last 3 Encounters:  10/23/20 182 lb 12.8 oz (82.9 kg)  07/26/20 188 lb (85.3 kg)  07/04/20 188 lb 4.8  oz (85.4 kg)   Objective:  Physical Exam Vitals reviewed.  Constitutional:      General: She is not in acute distress.    Appearance: Normal appearance. She is obese.  HENT:     Head: Normocephalic.     Right Ear: Tympanic membrane, ear canal and external ear normal. There is no impacted cerumen.     Left Ear: Tympanic membrane, ear canal and external ear normal. There is no impacted cerumen.     Nose: Nose normal. No congestion.     Mouth/Throat:     Mouth: Mucous membranes are moist.  Eyes:     Extraocular Movements: Extraocular movements intact.     Conjunctiva/sclera: Conjunctivae normal.     Pupils: Pupils are equal, round, and  reactive to light.  Cardiovascular:     Rate and Rhythm: Normal rate and regular rhythm.     Pulses: Normal pulses.     Heart sounds: Normal heart sounds. No murmur heard.   Pulmonary:     Effort: Pulmonary effort is normal. No respiratory distress.     Breath sounds: Normal breath sounds. No wheezing.  Skin:    General: Skin is warm and dry.     Capillary Refill: Capillary refill takes less than 2 seconds.  Neurological:     General: No focal deficit present.     Mental Status: She is alert and oriented to person, place, and time.     Cranial Nerves: No cranial nerve deficit.  Psychiatric:        Mood and Affect: Mood normal.        Behavior: Behavior normal.        Thought Content: Thought content normal.        Judgment: Judgment normal.         Assessment And Plan:     1. Prediabetes  Chronic, diet controlled  - CMP14+EGFR  2. Essential hypertension Chronic, fair control Continue with current medications  3. Mixed hyperlipidemia  Chronic, stable  no current medications - Lipid panel - CMP14+EGFR  4. Class 1 obesity due to excess calories with serious comorbidity and body mass index (BMI) of 32.0 to 32.9 in adult   Chronic  Discussed healthy diet and regular exercise options   Encouraged to exercise at least 150 minutes per week with 2 days of strength training   Patient was given opportunity to ask questions. Patient verbalized understanding of the plan and was able to repeat key elements of the plan. All questions were answered to their satisfaction.  Minette Brine, FNP   I, Minette Brine, FNP, have reviewed all documentation for this visit. The documentation on 10/23/20 for the exam, diagnosis, procedures, and orders are all accurate and complete.   IF YOU HAVE BEEN REFERRED TO A SPECIALIST, IT MAY TAKE 1-2 WEEKS TO SCHEDULE/PROCESS THE REFERRAL. IF YOU HAVE NOT HEARD FROM US/SPECIALIST IN TWO WEEKS, PLEASE GIVE [place] A CALL AT [phone].   THE  PATIENT IS ENCOURAGED TO PRACTICE SOCIAL DISTANCING DUE TO THE COVID-19 PANDEMIC.

## 2020-10-23 NOTE — Patient Instructions (Signed)
Diabetes Care, 44(Suppl 1), D40-C14. https://doi.org/https://doi.org/10.2337/dc21-S003">  Prediabetes Prediabetes is when your blood sugar (blood glucose) level is higher than normal but not high enough for you to be diagnosed with type 2 diabetes. Having prediabetes puts you at risk for developing type 2 diabetes (type 2 diabetes mellitus). With certain lifestyle changes, you may be able to prevent or delay the onset of type 2 diabetes. This is important because type 2 diabetes can lead to serious complications, such as:  Heart disease.  Stroke.  Blindness.  Kidney disease.  Depression.  Poor circulation in the feet and legs. In severe cases, this could lead to surgical removal of a leg (amputation). What are the causes? The exact cause of prediabetes is not known. It may result from insulin resistance. Insulin resistance develops when cells in the body do not respond properly to insulin that the body makes. This can cause excess glucose to build up in the blood. High blood glucose (hyperglycemia) can develop. What increases the risk? The following factors may make you more likely to develop this condition:  You have a family member with type 2 diabetes.  You are older than 45 years.  You had a temporary form of diabetes during a pregnancy (gestational diabetes).  You had polycystic ovary syndrome (PCOS).  You are overweight or obese.  You are inactive (sedentary).  You have a history of heart disease, including problems with cholesterol levels, high levels of blood fats, or high blood pressure. What are the signs or symptoms? You may have no symptoms. If you do have symptoms, they may include:  Increased hunger.  Increased thirst.  Increased urination.  Vision changes, such as blurry vision.  Tiredness (fatigue). How is this diagnosed? This condition can be diagnosed with blood tests. Your blood glucose may be checked with one or more of the following tests:  A  fasting blood glucose (FBG) test. You will not be allowed to eat (you will fast) for at least 8 hours before a blood sample is taken.  An A1C blood test (hemoglobin A1C). This test provides information about blood glucose levels over the previous 2?3 months.  An oral glucose tolerance test (OGTT). This test measures your blood glucose at two points in time: ? After fasting. This is your baseline level. ? Two hours after you drink a beverage that contains glucose. You may be diagnosed with prediabetes if:  Your FBG is 100?125 mg/dL (5.6-6.9 mmol/L).  Your A1C level is 5.7?6.4% (39-46 mmol/mol).  Your OGTT result is 140?199 mg/dL (7.8-11 mmol/L). These blood tests may be repeated to confirm your diagnosis.   How is this treated? Treatment may include dietary and lifestyle changes to help lower your blood glucose and prevent type 2 diabetes from developing. In some cases, medicine may be prescribed to help lower the risk of type 2 diabetes. Follow these instructions at home: Nutrition  Follow a healthy meal plan. This includes eating lean proteins, whole grains, legumes, fresh fruits and vegetables, low-fat dairy products, and healthy fats.  Follow instructions from your health care provider about eating or drinking restrictions.  Meet with a dietitian to create a healthy eating plan that is right for you.   Lifestyle  Do moderate-intensity exercise for at least 30 minutes a day on 5 or more days each week, or as told by your health care provider. A mix of activities may be best, such as: ? Brisk walking, swimming, biking, and weight lifting.  Lose weight as told by your health  care provider. Losing 5-7% of your body weight can reverse insulin resistance.  Do not drink alcohol if: ? Your health care provider tells you not to drink. ? You are pregnant, may be pregnant, or are planning to become pregnant.  If you drink alcohol: ? Limit how much you use to:  0-1 drink a day for  women.  0-2 drinks a day for men. ? Be aware of how much alcohol is in your drink. In the [place] one drink equals one 12 oz bottle of beer (355 mL), one 5 oz glass of wine (148 mL), or one 1 oz glass of hard liquor (44 mL). General instructions  Take over-the-counter and prescription medicines only as told by your health care provider. You may be prescribed medicines that help lower the risk of type 2 diabetes.  Do not use any products that contain nicotine or tobacco, such as cigarettes, e-cigarettes, and chewing tobacco. If you need help quitting, ask your health care provider.  Keep all follow-up visits. This is important. Where to find more information  American Diabetes Association: www.diabetes.org  Academy of Nutrition and Dietetics: www.eatright.org  American Heart Association: www.heart.org Contact a health care provider if:  You have any of these symptoms: ? Increased hunger. ? Increased urination. ? Increased thirst. ? Fatigue. ? Vision changes, such as blurry vision. Get help right away if you:  Have shortness of breath.  Feel confused.  Vomit or feel like you may vomit. Summary  Prediabetes is when your blood sugar (blood glucose)level is higher than normal but not high enough for you to be diagnosed with type 2 diabetes.  Having prediabetes puts you at risk for developing type 2 diabetes (type 2 diabetes mellitus).  Make lifestyle changes such as eating a healthy diet and exercising regularly to help prevent diabetes. Lose weight as told by your health care provider. This information is not intended to replace advice given to you by your health care provider. Make sure you discuss any questions you have with your health care provider. Document Revised: 08/25/2019 Document Reviewed: 08/25/2019 Elsevier Patient Education  2021 Elsevier Inc.  

## 2020-10-24 LAB — CMP14+EGFR
ALT: 51 IU/L — ABNORMAL HIGH (ref 0–32)
AST: 32 IU/L (ref 0–40)
Albumin/Globulin Ratio: 1.6 (ref 1.2–2.2)
Albumin: 4.4 g/dL (ref 3.8–4.9)
Alkaline Phosphatase: 55 IU/L (ref 44–121)
BUN/Creatinine Ratio: 16 (ref 9–23)
BUN: 11 mg/dL (ref 6–24)
Bilirubin Total: 0.3 mg/dL (ref 0.0–1.2)
CO2: 25 mmol/L (ref 20–29)
Calcium: 10.6 mg/dL — ABNORMAL HIGH (ref 8.7–10.2)
Chloride: 100 mmol/L (ref 96–106)
Creatinine, Ser: 0.7 mg/dL (ref 0.57–1.00)
Globulin, Total: 2.8 g/dL (ref 1.5–4.5)
Glucose: 79 mg/dL (ref 65–99)
Potassium: 4.1 mmol/L (ref 3.5–5.2)
Sodium: 138 mmol/L (ref 134–144)
Total Protein: 7.2 g/dL (ref 6.0–8.5)
eGFR: 104 mL/min/{1.73_m2} (ref >59)

## 2020-10-24 LAB — LIPID PANEL
Chol/HDL Ratio: 3.1 ratio (ref 0.0–4.4)
Cholesterol, Total: 210 mg/dL — ABNORMAL HIGH (ref 100–199)
HDL: 68 mg/dL (ref >39)
LDL Chol Calc (NIH): 131 mg/dL — ABNORMAL HIGH (ref 0–99)
Triglycerides: 63 mg/dL (ref 0–149)
VLDL Cholesterol Cal: 11 mg/dL (ref 5–40)

## 2020-10-25 ENCOUNTER — Other Ambulatory Visit: Payer: Self-pay | Admitting: Nurse Practitioner

## 2020-10-25 DIAGNOSIS — R748 Abnormal levels of other serum enzymes: Secondary | ICD-10-CM

## 2020-10-25 NOTE — Progress Notes (Signed)
I will order an abdominal ultrasound since this has been elevated two times in a row

## 2020-10-29 LAB — HEPATITIS C ANTIBODY: Hep C Virus Ab: <0.1 s/co ratio (ref 0.0–0.9)

## 2020-10-29 LAB — HEPATITIS B SURFACE ANTIBODY,QUALITATIVE: Hep B Surface Ab, Qual: REACTIVE

## 2020-10-29 LAB — SPECIMEN STATUS REPORT

## 2020-10-29 LAB — HEPATITIS A ANTIBODY, IGM: Hep A IgM: NEGATIVE

## 2020-10-29 NOTE — Progress Notes (Signed)
Okay, noted. She will need to cancel the appt she made with them in June

## 2020-11-15 ENCOUNTER — Ambulatory Visit
Admission: RE | Admit: 2020-11-15 | Discharge: 2020-11-15 | Disposition: A | Discharge status: Home / Self Care | Payer: 59 | Source: Ambulatory Visit | Attending: Nurse Practitioner | Admitting: Nurse Practitioner

## 2020-11-15 DIAGNOSIS — R748 Abnormal levels of other serum enzymes: Secondary | ICD-10-CM

## 2020-11-15 DIAGNOSIS — R7401 Elevation of levels of liver transaminase levels: Secondary | ICD-10-CM | POA: Diagnosis not present

## 2020-12-03 ENCOUNTER — Other Ambulatory Visit (HOSPITAL_COMMUNITY): Payer: Self-pay

## 2020-12-03 MED FILL — Olmesartan Medoxomil-Hydrochlorothiazide Tab 20-12.5 MG: ORAL | 90 days supply | Qty: 90 | Fill #0 | Status: AC

## 2021-01-31 ENCOUNTER — Other Ambulatory Visit: Payer: Self-pay

## 2021-01-31 ENCOUNTER — Other Ambulatory Visit (HOSPITAL_COMMUNITY): Payer: Self-pay

## 2021-01-31 ENCOUNTER — Encounter: Payer: Self-pay | Admitting: Nurse Practitioner

## 2021-01-31 ENCOUNTER — Ambulatory Visit (INDEPENDENT_AMBULATORY_CARE_PROVIDER_SITE_OTHER): Payer: 59 | Admitting: Nurse Practitioner

## 2021-01-31 VITALS — BP 126/82 | HR 74 | Temp 98.1°F | Ht 63.0 in | Wt 184.0 lb

## 2021-01-31 DIAGNOSIS — E782 Mixed hyperlipidemia: Secondary | ICD-10-CM | POA: Diagnosis not present

## 2021-01-31 DIAGNOSIS — E6609 Other obesity due to excess calories: Secondary | ICD-10-CM

## 2021-01-31 DIAGNOSIS — R7303 Prediabetes: Secondary | ICD-10-CM | POA: Diagnosis not present

## 2021-01-31 DIAGNOSIS — Z0000 Encounter for general adult medical examination without abnormal findings: Secondary | ICD-10-CM

## 2021-01-31 DIAGNOSIS — R748 Abnormal levels of other serum enzymes: Secondary | ICD-10-CM

## 2021-01-31 DIAGNOSIS — Z6832 Body mass index (BMI) 32.0-32.9, adult: Secondary | ICD-10-CM | POA: Diagnosis not present

## 2021-01-31 DIAGNOSIS — I10 Essential (primary) hypertension: Secondary | ICD-10-CM | POA: Diagnosis not present

## 2021-01-31 DIAGNOSIS — Z23 Encounter for immunization: Secondary | ICD-10-CM

## 2021-01-31 LAB — CMP14+EGFR
ALT: 50 IU/L — ABNORMAL HIGH (ref 0–32)
AST: 32 IU/L (ref 0–40)
Albumin/Globulin Ratio: 1.6 (ref 1.2–2.2)
Albumin: 4.4 g/dL (ref 3.8–4.9)
Alkaline Phosphatase: 73 IU/L (ref 44–121)
BUN/Creatinine Ratio: 13 (ref 9–23)
BUN: 10 mg/dL (ref 6–24)
Bilirubin Total: 0.3 mg/dL (ref 0.0–1.2)
CO2: 26 mmol/L (ref 20–29)
Calcium: 10.3 mg/dL — ABNORMAL HIGH (ref 8.7–10.2)
Chloride: 102 mmol/L (ref 96–106)
Creatinine, Ser: 0.78 mg/dL (ref 0.57–1.00)
Globulin, Total: 2.8 g/dL (ref 1.5–4.5)
Glucose: 89 mg/dL (ref 65–99)
Potassium: 4.2 mmol/L (ref 3.5–5.2)
Sodium: 142 mmol/L (ref 134–144)
Total Protein: 7.2 g/dL (ref 6.0–8.5)
eGFR: 91 mL/min/{1.73_m2} (ref >59)

## 2021-01-31 LAB — POCT URINALYSIS DIPSTICK
Bilirubin, UA: NEGATIVE
Blood, UA: NEGATIVE
Glucose, UA: NEGATIVE
Ketones, UA: NEGATIVE
Leukocytes, UA: NEGATIVE
Nitrite, UA: NEGATIVE
Protein, UA: NEGATIVE
Spec Grav, UA: 1.02 (ref 1.010–1.025)
Urobilinogen, UA: 0.2 E.U./dL
pH, UA: 6 (ref 5.0–8.0)

## 2021-01-31 LAB — LIPID PANEL
Chol/HDL Ratio: 3.2 ratio (ref 0.0–4.4)
Cholesterol, Total: 218 mg/dL — ABNORMAL HIGH (ref 100–199)
HDL: 68 mg/dL (ref >39)
LDL Chol Calc (NIH): 140 mg/dL — ABNORMAL HIGH (ref 0–99)
Triglycerides: 58 mg/dL (ref 0–149)
VLDL Cholesterol Cal: 10 mg/dL (ref 5–40)

## 2021-01-31 LAB — CBC
Hematocrit: 38.5 % (ref 34.0–46.6)
Hemoglobin: 12.8 g/dL (ref 11.1–15.9)
MCH: 27.7 pg (ref 26.6–33.0)
MCHC: 33.2 g/dL (ref 31.5–35.7)
MCV: 83 fL (ref 79–97)
Platelets: 267 10*3/uL (ref 150–450)
RBC: 4.62 x10E6/uL (ref 3.77–5.28)
RDW: 12.9 % (ref 11.7–15.4)
WBC: 4.6 10*3/uL (ref 3.4–10.8)

## 2021-01-31 LAB — POCT UA - MICROALBUMIN
Albumin/Creatinine Ratio, Urine, POC: 30
Creatinine, POC: 100 mg/dL
Microalbumin Ur, POC: 10 mg/L

## 2021-01-31 LAB — HEMOGLOBIN A1C
Est. average glucose Bld gHb Est-mCnc: 123 mg/dL
Hgb A1c MFr Bld: 5.9 % — ABNORMAL HIGH (ref 4.8–5.6)

## 2021-01-31 MED ORDER — SHINGRIX 50 MCG/0.5ML IM SUSR
0.5000 mL | Freq: Once | INTRAMUSCULAR | 1 refills | Status: AC
  Filled 2021-01-31: qty 0.5, 1d supply, fill #0
  Filled 2021-04-02: qty 0.5, 1d supply, fill #1

## 2021-01-31 NOTE — Patient Instructions (Signed)
Health Maintenance, Female Adopting a healthy lifestyle and getting preventive care are important in promoting health and wellness. Ask your health care provider about: The right schedule for you to have regular tests and exams. Things you can do on your own to prevent diseases and keep yourself healthy. What should I know about diet, weight, and exercise? Eat a healthy diet  Eat a diet that includes plenty of vegetables, fruits, low-fat dairy products, and lean protein. Do not eat a lot of foods that are high in solid fats, added sugars, or sodium.  Maintain a healthy weight Body mass index (BMI) is used to identify weight problems. It estimates body fat based on height and weight. Your health care provider can help determineyour BMI and help you achieve or maintain a healthy weight. Get regular exercise Get regular exercise. This is one of the most important things you can do for your health. Most adults should: Exercise for at least 150 minutes each week. The exercise should increase your heart rate and make you sweat (moderate-intensity exercise). Do strengthening exercises at least twice a week. This is in addition to the moderate-intensity exercise. Spend less time sitting. Even light physical activity can be beneficial. Watch cholesterol and blood lipids Have your blood tested for lipids and cholesterol at 53 years of age, then havethis test every 5 years. Have your cholesterol levels checked more often if: Your lipid or cholesterol levels are high. You are older than 53 years of age. You are at high risk for heart disease. What should I know about cancer screening? Depending on your health history and family history, you may need to have cancer screening at various ages. This may include screening for: Breast cancer. Cervical cancer. Colorectal cancer. Skin cancer. Lung cancer. What should I know about heart disease, diabetes, and high blood pressure? Blood pressure and heart  disease High blood pressure causes heart disease and increases the risk of stroke. This is more likely to develop in people who have high blood pressure readings, are of African descent, or are overweight. Have your blood pressure checked: Every 3-5 years if you are 18-39 years of age. Every year if you are 40 years old or older. Diabetes Have regular diabetes screenings. This checks your fasting blood sugar level. Have the screening done: Once every three years after age 40 if you are at a normal weight and have a low risk for diabetes. More often and at a younger age if you are overweight or have a high risk for diabetes. What should I know about preventing infection? Hepatitis B If you have a higher risk for hepatitis B, you should be screened for this virus. Talk with your health care provider to find out if you are at risk forhepatitis B infection. Hepatitis C Testing is recommended for: Everyone born from 1945 through 1965. Anyone with known risk factors for hepatitis C. Sexually transmitted infections (STIs) Get screened for STIs, including gonorrhea and chlamydia, if: You are sexually active and are younger than 53 years of age. You are older than 53 years of age and your health care provider tells you that you are at risk for this type of infection. Your sexual activity has changed since you were last screened, and you are at increased risk for chlamydia or gonorrhea. Ask your health care provider if you are at risk. Ask your health care provider about whether you are at high risk for HIV. Your health care provider may recommend a prescription medicine to help   prevent HIV infection. If you choose to take medicine to prevent HIV, you should first get tested for HIV. You should then be tested every 3 months for as long as you are taking the medicine. Pregnancy If you are about to stop having your period (premenopausal) and you may become pregnant, seek counseling before you get  pregnant. Take 400 to 800 micrograms (mcg) of folic acid every day if you become pregnant. Ask for birth control (contraception) if you want to prevent pregnancy. Osteoporosis and menopause Osteoporosis is a disease in which the bones lose minerals and strength with aging. This can result in bone fractures. If you are 65 years old or older, or if you are at risk for osteoporosis and fractures, ask your health care provider if you should: Be screened for bone loss. Take a calcium or vitamin D supplement to lower your risk of fractures. Be given hormone replacement therapy (HRT) to treat symptoms of menopause. Follow these instructions at home: Lifestyle Do not use any products that contain nicotine or tobacco, such as cigarettes, e-cigarettes, and chewing tobacco. If you need help quitting, ask your health care provider. Do not use street drugs. Do not share needles. Ask your health care provider for help if you need support or information about quitting drugs. Alcohol use Do not drink alcohol if: Your health care provider tells you not to drink. You are pregnant, may be pregnant, or are planning to become pregnant. If you drink alcohol: Limit how much you use to 0-1 drink a day. Limit intake if you are breastfeeding. Be aware of how much alcohol is in your drink. In the [place] one drink equals one 12 oz bottle of beer (355 mL), one 5 oz glass of wine (148 mL), or one 1 oz glass of hard liquor (44 mL). General instructions Schedule regular health, dental, and eye exams. Stay current with your vaccines. Tell your health care provider if: You often feel depressed. You have ever been abused or do not feel safe at home. Summary Adopting a healthy lifestyle and getting preventive care are important in promoting health and wellness. Follow your health care provider's instructions about healthy diet, exercising, and getting tested or screened for diseases. Follow your health care provider's  instructions on monitoring your cholesterol and blood pressure. This information is not intended to replace advice given to you by your health care provider. Make sure you discuss any questions you have with your healthcare provider. Document Revised: 05/19/2018 Document Reviewed: 05/19/2018 Elsevier Patient Education  2022 Elsevier Inc.  

## 2021-01-31 NOTE — Progress Notes (Signed)
I,Katawbba Wiggins,acting as a Education administrator for [employer], FNP.,have documented all relevant documentation on the behalf of Minette Brine, FNP,as directed by  Minette Brine, FNP while in the presence of Minette Brine, [place].   This visit occurred during the SARS-CoV-2 public health emergency.  Safety protocols were in place, including screening questions prior to the visit, additional usage of staff PPE, and extensive cleaning of exam room while observing appropriate contact time as indicated for disinfecting solutions.  Subjective:     Patient ID: Erin Bowers , female    DOB: 17-May-1966 , 55 y.o.   MRN: [id number]   Chief Complaint  Patient presents with   Annual Exam     HPI  The patient is here today for a physical examination. The patient is followed by Dr. Dellis Filbert, last pap was 2021.  Patient states she no longer has to have paps.   Wt Readings from Last 3 Encounters: 01/31/21 : 184 lb (83.5 kg) 10/23/20 : 182 lb 12.8 oz (82.9 kg) 07/26/20 : 188 lb (85.3 kg)      Past Medical History:  Diagnosis Date   Blood transfusion without reported diagnosis     15 years ago   Breast cancer ([place])    Heart murmur    Hypertension    Personal history of radiation therapy 2017     Family History  Problem Relation Age of Onset   Stroke Mother    Hypertension Mother    Stroke Father    Colon cancer Neg Hx    Esophageal cancer Neg Hx    Rectal cancer Neg Hx    Stomach cancer Neg Hx      Current Outpatient Medications:    Ascorbic Acid (VITAMIN C) 1000 MG tablet, Take 1,000 mg by mouth daily., Disp: , Rfl:    Multiple Vitamin (MULTIVITAMIN) tablet, Take 1 tablet by mouth daily., Disp: , Rfl:    Omega-3 Fatty Acids (FISH OIL) 1000 MG CAPS, Take by mouth., Disp: , Rfl:    olmesartan-hydrochlorothiazide (BENICAR HCT) 20-12.5 MG tablet, TAKE 1 TABLET BY MOUTH ONCE DAILY., Disp: 90 tablet, Rfl: 1   Allergies  Allergen Reactions   Adhesive [Tape] Other (See Comments)    Steri-strips  cause blisters       The patient states she is status post hysterectomy. No LMP recorded. Patient has had a hysterectomy.. Negative for Dysmenorrhea and Negative for Menorrhagia. Negative for: breast discharge, breast lump(s), breast pain and breast self exam. Associated symptoms include abnormal vaginal bleeding. Pertinent negatives include abnormal bleeding (hematology), anxiety, decreased libido, depression, difficulty falling sleep, dyspareunia, history of infertility, nocturia, sexual dysfunction, sleep disturbances, urinary incontinence, urinary urgency, vaginal discharge and vaginal itching. Diet regular eating more vegetables. The patient states her exercise level is moderate every other day with stationary bike (cycling)  The patient's tobacco use is:  Social History   Tobacco Use  Smoking Status Never  Smokeless Tobacco Never   She has been exposed to passive smoke. The patient's alcohol use is:  Social History   Substance and Sexual Activity  Alcohol Use No   Additional information: Last pap 2021, next one scheduled for .    Review of Systems  Constitutional: Negative.   HENT: Negative.    Eyes: Negative.   Respiratory: Negative.    Cardiovascular: Negative.   Gastrointestinal: Negative.   Endocrine: Negative.   Genitourinary: Negative.   Musculoskeletal: Negative.   Skin: Negative.   Allergic/Immunologic: Negative.   Neurological: Negative.   Hematological: Negative.  Psychiatric/Behavioral: Negative.      Today's Vitals   01/31/21 0837  BP: 126/82  Pulse: 74  Temp: 98.1 F (36.7 C)  TempSrc: Oral  Weight: 184 lb (83.5 kg)  Height: 5' 3"  (1.6 m)   Body mass index is 32.59 kg/m.  Wt Readings from Last 3 Encounters:  01/31/21 184 lb (83.5 kg)  10/23/20 182 lb 12.8 oz (82.9 kg)  07/26/20 188 lb (85.3 kg)    BP Readings from Last 3 Encounters:  01/31/21 126/82  10/23/20 132/80  07/26/20 122/76    Objective:  Physical Exam Vitals reviewed.   Constitutional:      General: She is not in acute distress.    Appearance: Normal appearance. She is well-developed. She is obese.  HENT:     Head: Normocephalic and atraumatic.     Right Ear: Hearing, tympanic membrane, ear canal and external ear normal. There is no impacted cerumen.     Left Ear: Hearing, tympanic membrane, ear canal and external ear normal. There is no impacted cerumen.     Nose:     Comments: Deferred - masked    Mouth/Throat:     Comments: Deferred - masked Eyes:     General: Lids are normal.     Extraocular Movements: Extraocular movements intact.     Conjunctiva/sclera: Conjunctivae normal.     Pupils: Pupils are equal, round, and reactive to light.     Funduscopic exam:    Right eye: No papilledema.        Left eye: No papilledema.  Neck:     Thyroid: No thyroid mass.     Vascular: No carotid bruit.  Cardiovascular:     Rate and Rhythm: Normal rate and regular rhythm.     Pulses: Normal pulses.     Heart sounds: Normal heart sounds. No murmur heard. Pulmonary:     Effort: Pulmonary effort is normal. No respiratory distress.     Breath sounds: Normal breath sounds.  Chest:     Chest wall: No mass.  Breasts:    Tanner Score is 5.     Right: Normal. No mass or tenderness.     Left: Normal. No mass or tenderness.  Abdominal:     General: Abdomen is flat. Bowel sounds are normal. There is no distension.     Palpations: Abdomen is soft.     Tenderness: There is no abdominal tenderness.  Genitourinary:    Rectum: Guaiac result negative.  Musculoskeletal:        General: No swelling. Normal range of motion.     Cervical back: Full passive range of motion without pain, normal range of motion and neck supple.     Right lower leg: No edema.     Left lower leg: No edema.  Lymphadenopathy:     Upper Body:     Right upper body: No supraclavicular, axillary or pectoral adenopathy.     Left upper body: No supraclavicular, axillary or pectoral adenopathy.   Skin:    General: Skin is warm and dry.     Capillary Refill: Capillary refill takes less than 2 seconds.  Neurological:     General: No focal deficit present.     Mental Status: She is alert and oriented to person, place, and time.     Cranial Nerves: No cranial nerve deficit.     Sensory: No sensory deficit.     Motor: No weakness.  Psychiatric:        Mood and Affect:  Mood normal.        Behavior: Behavior normal.        Thought Content: Thought content normal.        Judgment: Judgment normal.        Assessment And Plan:     1. Encounter for general adult medical examination w/o abnormal findings  2. Class 1 obesity due to excess calories with serious comorbidity and body mass index (BMI) of 32.0 to 32.9 in adult She is encouraged to strive for BMI less than 30 to decrease cardiac risk. Advised to aim for at least 150 minutes of exercise per week.   3. Essential hypertension Comments: Blood pressure is controlled, continue current medications - POCT Urinalysis Dipstick (81002) - POCT UA - Microalbumin - EKG 12-Lead - CMP14+EGFR  4. Prediabetes Comments: Stable, continue with healthy diet and regular exercise - Hemoglobin A1c - CMP14+EGFR  5. Mixed hyperlipidemia Comments: No current medications, continue with fish oil - Lipid panel  6. Elevated liver enzymes Comments: Liver enzymes were slightly elevated, will recheck levels - CBC - CMP14+EGFR  7. Immunization due Rx sent to pharmacy - Zoster Vaccine Adjuvanted [facility]) injection; Inject 0.5 mLs into the muscle once for 1 dose.  Dispense: 0.5 mL; Refill: 1    Patient was given opportunity to ask questions. Patient verbalized understanding of the plan and was able to repeat key elements of the plan. All questions were answered to their satisfaction.   Minette Brine, FNP   I, Minette Brine, FNP, have reviewed all documentation for this visit. The documentation on 01/31/21 for the exam, diagnosis, procedures,  and orders are all accurate and complete.   THE PATIENT IS ENCOURAGED TO PRACTICE SOCIAL DISTANCING DUE TO THE COVID-19 PANDEMIC.

## 2021-02-23 DIAGNOSIS — H524 Presbyopia: Secondary | ICD-10-CM | POA: Diagnosis not present

## 2021-03-04 ENCOUNTER — Other Ambulatory Visit: Payer: Self-pay | Admitting: Nurse Practitioner

## 2021-03-04 ENCOUNTER — Other Ambulatory Visit (HOSPITAL_COMMUNITY): Payer: Self-pay

## 2021-03-04 DIAGNOSIS — E782 Mixed hyperlipidemia: Secondary | ICD-10-CM

## 2021-03-04 MED ORDER — OLMESARTAN MEDOXOMIL-HCTZ 20-12.5 MG PO TABS
1.0000 | ORAL_TABLET | Freq: Every day | ORAL | 1 refills | Status: DC
  Filled 2021-03-04: qty 90, 90d supply, fill #0
  Filled 2021-06-06: qty 90, 90d supply, fill #1

## 2021-04-02 ENCOUNTER — Other Ambulatory Visit (HOSPITAL_COMMUNITY): Payer: Self-pay

## 2021-05-13 ENCOUNTER — Other Ambulatory Visit (HOSPITAL_COMMUNITY): Payer: Self-pay

## 2021-06-04 ENCOUNTER — Other Ambulatory Visit: Payer: Self-pay | Admitting: Nurse Practitioner

## 2021-06-04 DIAGNOSIS — Z1231 Encounter for screening mammogram for malignant neoplasm of breast: Secondary | ICD-10-CM

## 2021-06-06 ENCOUNTER — Other Ambulatory Visit (HOSPITAL_COMMUNITY): Payer: Self-pay

## 2021-07-15 ENCOUNTER — Ambulatory Visit
Admission: RE | Admit: 2021-07-15 | Discharge: 2021-07-15 | Disposition: A | Discharge status: Home / Self Care | Payer: 59 | Source: Ambulatory Visit | Attending: Nurse Practitioner | Admitting: Nurse Practitioner

## 2021-07-15 ENCOUNTER — Other Ambulatory Visit: Payer: Self-pay

## 2021-07-15 DIAGNOSIS — Z1231 Encounter for screening mammogram for malignant neoplasm of breast: Secondary | ICD-10-CM

## 2021-08-06 ENCOUNTER — Other Ambulatory Visit: Payer: Self-pay

## 2021-08-06 ENCOUNTER — Ambulatory Visit: Payer: 59 | Admitting: Nurse Practitioner

## 2021-08-06 ENCOUNTER — Other Ambulatory Visit (HOSPITAL_COMMUNITY): Payer: Self-pay

## 2021-08-06 ENCOUNTER — Encounter: Payer: Self-pay | Admitting: Nurse Practitioner

## 2021-08-06 VITALS — BP 134/80 | HR 78 | Temp 98.1°F | Ht 63.0 in | Wt 184.8 lb

## 2021-08-06 DIAGNOSIS — Z6832 Body mass index (BMI) 32.0-32.9, adult: Secondary | ICD-10-CM

## 2021-08-06 DIAGNOSIS — E782 Mixed hyperlipidemia: Secondary | ICD-10-CM

## 2021-08-06 DIAGNOSIS — I10 Essential (primary) hypertension: Secondary | ICD-10-CM

## 2021-08-06 DIAGNOSIS — E6609 Other obesity due to excess calories: Secondary | ICD-10-CM | POA: Diagnosis not present

## 2021-08-06 DIAGNOSIS — C50212 Malignant neoplasm of upper-inner quadrant of left female breast: Secondary | ICD-10-CM

## 2021-08-06 DIAGNOSIS — Z170 Estrogen receptor positive status [ER+]: Secondary | ICD-10-CM | POA: Diagnosis not present

## 2021-08-06 DIAGNOSIS — R7303 Prediabetes: Secondary | ICD-10-CM | POA: Diagnosis not present

## 2021-08-06 MED ORDER — OLMESARTAN MEDOXOMIL-HCTZ 20-12.5 MG PO TABS
1.0000 | ORAL_TABLET | Freq: Every day | ORAL | 1 refills | Status: DC
  Filled 2021-08-06 – 2021-08-29 (×2): qty 90, 90d supply, fill #0
  Filled 2021-11-28: qty 90, 90d supply, fill #1

## 2021-08-06 NOTE — Progress Notes (Signed)
I,Victoria T Hamilton,acting as a Education administrator for Minette Brine, FNP.,have documented all relevant documentation on the behalf of Minette Brine, FNP,as directed by  Minette Brine, FNP while in the presence of Minette Brine, [place].   This visit occurred during the SARS-CoV-2 public health emergency.  Safety protocols were in place, including screening questions prior to the visit, additional usage of staff PPE, and extensive cleaning of exam room while observing appropriate contact time as indicated for disinfecting solutions.  Subjective:     Patient ID: Erin Bowers , female    DOB: 03-25-1966 , 56 y.o.   MRN: [id number]   Chief Complaint  Patient presents with   Hypertension   Diabetes   Hyperlipidemia    HPI  Pt presents today for BPC. Pt currently has GYN, provider told her to only come get a PAP if needed, since she is not currently sexually active. She has also had a hysterectomy  Wt Readings from Last 3 Encounters: 08/06/21 : 184 lb 12.8 oz (83.8 kg) 01/31/21 : 184 lb (83.5 kg) 10/23/20 : 182 lb 12.8 oz (82.9 kg)  She does admit to needing to exercise more.   Hypertension  Diabetes  Hyperlipidemia    Past Medical History:  Diagnosis Date   Blood transfusion without reported diagnosis     15 years ago   Breast cancer ([place])    Heart murmur    Hypertension    Personal history of radiation therapy 2017     Family History  Problem Relation Age of Onset   Stroke Mother    Hypertension Mother    Stroke Father    Colon cancer Neg Hx    Esophageal cancer Neg Hx    Rectal cancer Neg Hx    Stomach cancer Neg Hx      Current Outpatient Medications:    Ascorbic Acid (VITAMIN C) 1000 MG tablet, Take 1,000 mg by mouth daily., Disp: , Rfl:    Multiple Vitamin (MULTIVITAMIN) tablet, Take 1 tablet by mouth daily., Disp: , Rfl:    Omega-3 Fatty Acids (FISH OIL) 1000 MG CAPS, Take by mouth., Disp: , Rfl:    olmesartan-hydrochlorothiazide (BENICAR HCT) 20-12.5 MG tablet, Take 1  tablet by mouth daily., Disp: 90 tablet, Rfl: 1   Allergies  Allergen Reactions   Adhesive [Tape] Other (See Comments)    Steri-strips cause blisters      Review of Systems  Constitutional: Negative.   Respiratory: Negative.    Cardiovascular: Negative.   Neurological: Negative.   Psychiatric/Behavioral: Negative.      Today's Vitals   08/06/21 0956  BP: (!) 142/90  Pulse: 78  Temp: 98.1 F (36.7 C)  Weight: 184 lb 12.8 oz (83.8 kg)  Height: 5' 3"  (1.6 m)   Body mass index is 32.74 kg/m.  Wt Readings from Last 3 Encounters:  08/06/21 184 lb 12.8 oz (83.8 kg)  01/31/21 184 lb (83.5 kg)  10/23/20 182 lb 12.8 oz (82.9 kg)    Objective:  Physical Exam Vitals reviewed.  Constitutional:      General: She is not in acute distress.    Appearance: Normal appearance. She is obese.  HENT:     Head: Normocephalic.  Eyes:     Extraocular Movements: Extraocular movements intact.     Conjunctiva/sclera: Conjunctivae normal.     Pupils: Pupils are equal, round, and reactive to light.  Cardiovascular:     Rate and Rhythm: Normal rate and regular rhythm.     Pulses: Normal pulses.  Heart sounds: Normal heart sounds. No murmur heard. Pulmonary:     Effort: Pulmonary effort is normal. No respiratory distress.     Breath sounds: Normal breath sounds. No wheezing.  Skin:    General: Skin is warm and dry.     Capillary Refill: Capillary refill takes less than 2 seconds.  Neurological:     General: No focal deficit present.     Mental Status: She is alert and oriented to person, place, and time.     Cranial Nerves: No cranial nerve deficit.     Motor: No weakness.  Psychiatric:        Mood and Affect: Mood normal.        Behavior: Behavior normal.        Thought Content: Thought content normal.        Judgment: Judgment normal.        Assessment And Plan:     1. Essential hypertension Comments: Blood pressure is elevated slightly today, she has just taken her  medications while in the office.  - BMP8+eGFR  2. Prediabetes Comments: HgbA1c increased at last visit, encouraged to increase physical activity and cut back on sweets.  - Hemoglobin A1c  3. Mixed hyperlipidemia Comments: Cholesterol levels were elevated at last visit, encouraged to cut back on junk food/sweets. - olmesartan-hydrochlorothiazide (BENICAR HCT) 20-12.5 MG tablet; Take 1 tablet by mouth daily.  Dispense: 90 tablet; Refill: 1 - BMP8+eGFR - Hemoglobin A1c - Lipid panel  4. Class 1 obesity due to excess calories without serious comorbidity with body mass index (BMI) of 32.0 to 32.9 in adult Comments: Weight is stable, encouraged to exercise regularly especially with the weather getting warmer  5. Malignant neoplasm of upper-inner quadrant of left breast in female, estrogen receptor positive ([place]) Comments: She took her last dose of Tamoxifen in June 2022, no longer following up with Dr. Lindi Adie     Patient was given opportunity to ask questions. Patient verbalized understanding of the plan and was able to repeat key elements of the plan. All questions were answered to their satisfaction.  Minette Brine, FNP   I, Minette Brine, FNP, have reviewed all documentation for this visit. The documentation on 08/06/21 for the exam, diagnosis, procedures, and orders are all accurate and complete.   IF YOU HAVE BEEN REFERRED TO A SPECIALIST, IT MAY TAKE 1-2 WEEKS TO SCHEDULE/PROCESS THE REFERRAL. IF YOU HAVE NOT HEARD FROM US/SPECIALIST IN TWO WEEKS, PLEASE GIVE [place] A CALL AT [phone] X [phone].   THE PATIENT IS ENCOURAGED TO PRACTICE SOCIAL DISTANCING DUE TO THE COVID-19 PANDEMIC.

## 2021-08-06 NOTE — Patient Instructions (Signed)

## 2021-08-07 LAB — BMP8+EGFR
BUN/Creatinine Ratio: 17 (ref 9–23)
BUN: 11 mg/dL (ref 6–24)
CO2: 26 mmol/L (ref 20–29)
Calcium: 10 mg/dL (ref 8.7–10.2)
Chloride: 100 mmol/L (ref 96–106)
Creatinine, Ser: 0.66 mg/dL (ref 0.57–1.00)
Glucose: 80 mg/dL (ref 70–99)
Potassium: 4.1 mmol/L (ref 3.5–5.2)
Sodium: 139 mmol/L (ref 134–144)
eGFR: 105 mL/min/{1.73_m2} (ref >59)

## 2021-08-07 LAB — LIPID PANEL
Chol/HDL Ratio: 3.7 ratio (ref 0.0–4.4)
Cholesterol, Total: 236 mg/dL — ABNORMAL HIGH (ref 100–199)
HDL: 63 mg/dL (ref >39)
LDL Chol Calc (NIH): 161 mg/dL — ABNORMAL HIGH (ref 0–99)
Triglycerides: 69 mg/dL (ref 0–149)
VLDL Cholesterol Cal: 12 mg/dL (ref 5–40)

## 2021-08-07 LAB — HEMOGLOBIN A1C
Est. average glucose Bld gHb Est-mCnc: 123 mg/dL
Hgb A1c MFr Bld: 5.9 % — ABNORMAL HIGH (ref 4.8–5.6)

## 2021-08-29 ENCOUNTER — Other Ambulatory Visit (HOSPITAL_COMMUNITY): Payer: Self-pay

## 2021-11-28 ENCOUNTER — Other Ambulatory Visit (HOSPITAL_COMMUNITY): Payer: Self-pay

## 2021-12-05 ENCOUNTER — Ambulatory Visit: Payer: 59 | Admitting: Nurse Practitioner

## 2021-12-05 ENCOUNTER — Encounter: Payer: Self-pay | Admitting: Nurse Practitioner

## 2021-12-05 ENCOUNTER — Other Ambulatory Visit (HOSPITAL_COMMUNITY): Payer: Self-pay

## 2021-12-05 VITALS — BP 122/68 | HR 59 | Temp 98.0°F | Ht 63.0 in | Wt 186.6 lb

## 2021-12-05 DIAGNOSIS — R7303 Prediabetes: Secondary | ICD-10-CM

## 2021-12-05 DIAGNOSIS — E6609 Other obesity due to excess calories: Secondary | ICD-10-CM

## 2021-12-05 DIAGNOSIS — Z79899 Other long term (current) drug therapy: Secondary | ICD-10-CM

## 2021-12-05 DIAGNOSIS — I10 Essential (primary) hypertension: Secondary | ICD-10-CM | POA: Diagnosis not present

## 2021-12-05 DIAGNOSIS — E782 Mixed hyperlipidemia: Secondary | ICD-10-CM | POA: Diagnosis not present

## 2021-12-05 DIAGNOSIS — Z6833 Body mass index (BMI) 33.0-33.9, adult: Secondary | ICD-10-CM | POA: Diagnosis not present

## 2021-12-05 MED ORDER — OLMESARTAN MEDOXOMIL-HCTZ 20-12.5 MG PO TABS
1.0000 | ORAL_TABLET | Freq: Every day | ORAL | 1 refills | Status: DC
  Filled 2021-12-05: qty 90, 90d supply, fill #0

## 2021-12-05 NOTE — Progress Notes (Signed)
I,[name],acting as a Education administrator for [employer], FNP.,have documented all relevant documentation on the behalf of Minette Brine, FNP,as directed by  Minette Brine, FNP while in the presence of Minette Brine, [place].  Subjective:     Patient ID: Erin Bowers , female    DOB: May 11, 1966 , 56 y.o.   MRN: [id number]   Chief Complaint  Patient presents with   Hypertension    HPI  Here for blood pressure check. She has made some changes to her diet. She is doing some strength training than cardio. She has been increasing her fiber.   Hypertension This is a chronic problem. The current episode started more than 1 year ago. The problem is unchanged. The problem is controlled. Pertinent negatives include no anxiety, chest pain, headaches or palpitations. There are no associated agents to hypertension. Risk factors for coronary artery disease include obesity. Past treatments include diuretics and angiotensin blockers. The current treatment provides significant improvement. There are no compliance problems.  There is no history of angina. There is no history of chronic renal disease.     Past Medical History:  Diagnosis Date   Blood transfusion without reported diagnosis     15 years ago   Breast cancer ([place])    Heart murmur    Hypertension    Personal history of radiation therapy 2017     Family History  Problem Relation Age of Onset   Stroke Mother    Hypertension Mother    Stroke Father    Colon cancer Neg Hx    Esophageal cancer Neg Hx    Rectal cancer Neg Hx    Stomach cancer Neg Hx      Current Outpatient Medications:    Ascorbic Acid (VITAMIN C) 1000 MG tablet, Take 1,000 mg by mouth daily., Disp: , Rfl:    Multiple Vitamin (MULTIVITAMIN) tablet, Take 1 tablet by mouth daily., Disp: , Rfl:    olmesartan-hydrochlorothiazide (BENICAR HCT) 20-12.5 MG tablet, Take 1 tablet by mouth daily., Disp: 90 tablet, Rfl: 1   Omega-3 Fatty Acids (FISH OIL) 1000 MG CAPS, Take by mouth.,  Disp: , Rfl:    Allergies  Allergen Reactions   Adhesive [Tape] Other (See Comments)    Steri-strips cause blisters      Review of Systems  Constitutional: Negative.   Cardiovascular:  Negative for chest pain and palpitations.  Neurological:  Negative for headaches.  Psychiatric/Behavioral: Negative.       Today's Vitals   12/05/21 0830  BP: 122/68  Pulse: (!) 59  Temp: 98 F (36.7 C)  TempSrc: Oral  Weight: 186 lb 9.6 oz (84.6 kg)  Height: _0  (1.6 m)   Body mass index is 33.05 kg/m.  Wt Readings from Last 3 Encounters:  12/05/21 186 lb 9.6 oz (84.6 kg)  08/06/21 184 lb 12.8 oz (83.8 kg)  01/31/21 184 lb (83.5 kg)    Objective:  Physical Exam Vitals reviewed.  Constitutional:      General: She is not in acute distress.    Appearance: Normal appearance. She is obese.  HENT:     Head: Normocephalic.  Eyes:     Extraocular Movements: Extraocular movements intact.     Pupils: Pupils are equal, round, and reactive to light.  Cardiovascular:     Rate and Rhythm: Normal rate and regular rhythm.     Pulses: Normal pulses.     Heart sounds: Normal heart sounds. No murmur heard. Pulmonary:     Effort: Pulmonary effort  is normal. No respiratory distress.     Breath sounds: Normal breath sounds. No wheezing.  Skin:    General: Skin is warm and dry.     Capillary Refill: Capillary refill takes less than 2 seconds.  Neurological:     General: No focal deficit present.     Mental Status: She is alert and oriented to person, place, and time.     Cranial Nerves: No cranial nerve deficit.     Motor: No weakness.  Psychiatric:        Mood and Affect: Mood normal.        Behavior: Behavior normal.        Thought Content: Thought content normal.        Judgment: Judgment normal.         Assessment And Plan:     1. Essential hypertension Comments: Blood pressure is better controlled, continue current medications - olmesartan-hydrochlorothiazide (BENICAR HCT)  20-12.5 MG tablet; Take 1 tablet by mouth daily.  Dispense: 90 tablet; Refill: 1 - BMP8+eGFR  2. Prediabetes Comments: Hgba1c was slightly elevated last draw, will check HgbA1c today. Encouraged to increase physical activity - Hemoglobin A1c  3. Mixed hyperlipidemia Comments: Cholesterol levels were more elevated, will recheck levels however discussed if continues to be elevated to consider statin at least 3 days a week.  - Lipid panel  4. Class 1 obesity due to excess calories with serious comorbidity and body mass index (BMI) of 33.0 to 33.9 in adult  She is encouraged to strive for BMI less than 30 to decrease cardiac risk. Advised to aim for at least 150 minutes of exercise per week.  5. Other long term (current) drug therapy - CBC    Patient was given opportunity to ask questions. Patient verbalized understanding of the plan and was able to repeat key elements of the plan. All questions were answered to their satisfaction.  Minette Brine, FNP   I, Minette Brine, FNP, have reviewed all documentation for this visit. The documentation on 12/05/21 for the exam, diagnosis, procedures, and orders are all accurate and complete.   IF YOU HAVE BEEN REFERRED TO A SPECIALIST, IT MAY TAKE 1-2 WEEKS TO SCHEDULE/PROCESS THE REFERRAL. IF YOU HAVE NOT HEARD FROM US/SPECIALIST IN TWO WEEKS, PLEASE GIVE [place] A CALL AT [phone].   THE PATIENT IS ENCOURAGED TO PRACTICE SOCIAL DISTANCING DUE TO THE COVID-19 PANDEMIC.

## 2021-12-05 NOTE — Patient Instructions (Signed)

## 2021-12-06 LAB — BMP8+EGFR
BUN/Creatinine Ratio: 17 (ref 9–23)
BUN: 13 mg/dL (ref 6–24)
CO2: 25 mmol/L (ref 20–29)
Calcium: 10.5 mg/dL — ABNORMAL HIGH (ref 8.7–10.2)
Chloride: 100 mmol/L (ref 96–106)
Creatinine, Ser: 0.78 mg/dL (ref 0.57–1.00)
Glucose: 87 mg/dL (ref 70–99)
Potassium: 4.5 mmol/L (ref 3.5–5.2)
Sodium: 141 mmol/L (ref 134–144)
eGFR: 91 mL/min/{1.73_m2} (ref >59)

## 2021-12-06 LAB — CBC
Hematocrit: 39.2 % (ref 34.0–46.6)
Hemoglobin: 13.1 g/dL (ref 11.1–15.9)
MCH: 27.4 pg (ref 26.6–33.0)
MCHC: 33.4 g/dL (ref 31.5–35.7)
MCV: 82 fL (ref 79–97)
Platelets: 258 10*3/uL (ref 150–450)
RBC: 4.78 x10E6/uL (ref 3.77–5.28)
RDW: 13 % (ref 11.7–15.4)
WBC: 5.3 10*3/uL (ref 3.4–10.8)

## 2021-12-06 LAB — HEMOGLOBIN A1C
Est. average glucose Bld gHb Est-mCnc: 120 mg/dL
Hgb A1c MFr Bld: 5.8 % — ABNORMAL HIGH (ref 4.8–5.6)

## 2021-12-06 LAB — LIPID PANEL
Chol/HDL Ratio: 3.4 ratio (ref 0.0–4.4)
Cholesterol, Total: 228 mg/dL — ABNORMAL HIGH (ref 100–199)
HDL: 67 mg/dL (ref >39)
LDL Chol Calc (NIH): 153 mg/dL — ABNORMAL HIGH (ref 0–99)
Triglycerides: 50 mg/dL (ref 0–149)
VLDL Cholesterol Cal: 8 mg/dL (ref 5–40)

## 2022-02-05 ENCOUNTER — Ambulatory Visit (INDEPENDENT_AMBULATORY_CARE_PROVIDER_SITE_OTHER): Payer: 59 | Admitting: Nurse Practitioner

## 2022-02-05 ENCOUNTER — Other Ambulatory Visit (HOSPITAL_COMMUNITY): Payer: Self-pay

## 2022-02-05 ENCOUNTER — Encounter: Payer: Self-pay | Admitting: Nurse Practitioner

## 2022-02-05 VITALS — BP 136/80 | HR 75 | Temp 98.3°F | Ht 63.0 in | Wt 184.0 lb

## 2022-02-05 DIAGNOSIS — E782 Mixed hyperlipidemia: Secondary | ICD-10-CM | POA: Diagnosis not present

## 2022-02-05 DIAGNOSIS — R7303 Prediabetes: Secondary | ICD-10-CM

## 2022-02-05 DIAGNOSIS — Z6832 Body mass index (BMI) 32.0-32.9, adult: Secondary | ICD-10-CM | POA: Diagnosis not present

## 2022-02-05 DIAGNOSIS — E6609 Other obesity due to excess calories: Secondary | ICD-10-CM

## 2022-02-05 DIAGNOSIS — Z853 Personal history of malignant neoplasm of breast: Secondary | ICD-10-CM

## 2022-02-05 DIAGNOSIS — Z79899 Other long term (current) drug therapy: Secondary | ICD-10-CM | POA: Diagnosis not present

## 2022-02-05 DIAGNOSIS — Z0000 Encounter for general adult medical examination without abnormal findings: Secondary | ICD-10-CM

## 2022-02-05 DIAGNOSIS — I10 Essential (primary) hypertension: Secondary | ICD-10-CM

## 2022-02-05 LAB — POCT URINALYSIS DIPSTICK
Bilirubin, UA: NEGATIVE
Blood, UA: NEGATIVE
Glucose, UA: NEGATIVE
Ketones, UA: NEGATIVE
Leukocytes, UA: NEGATIVE
Nitrite, UA: NEGATIVE
Protein, UA: NEGATIVE
Spec Grav, UA: 1.02 (ref 1.010–1.025)
Urobilinogen, UA: 0.2 E.U./dL
pH, UA: 6 (ref 5.0–8.0)

## 2022-02-05 MED ORDER — OLMESARTAN MEDOXOMIL-HCTZ 20-12.5 MG PO TABS
1.0000 | ORAL_TABLET | Freq: Every day | ORAL | 1 refills | Status: DC
  Filled 2022-02-05 – 2022-02-23 (×2): qty 90, 90d supply, fill #0
  Filled 2022-05-26: qty 90, 90d supply, fill #1

## 2022-02-05 NOTE — Patient Instructions (Signed)

## 2022-02-05 NOTE — Progress Notes (Signed)
I,[name],acting as a Education administrator for [employer], FNP.,have documented all relevant documentation on the behalf of Minette Brine, FNP,as directed by  Minette Brine, FNP while in the presence of Minette Brine, [name].  Subjective:     Patient ID: Erin Bowers , female    DOB: March 16, 1966 , 56 y.o.   MRN: [id number]   Chief Complaint  Patient presents with   Annual Exam    HPI  The patient is here today for a physical examination. The patient is followed by Dr. Dellis Filbert, last pap was 2021.  Patient states she no longer has to have paps.   Wt Readings from Last 3 Encounters: 02/05/22 : 184 lb (83.5 kg) 12/05/21 : 186 lb 9.6 oz (84.6 kg) 08/06/21 : 184 lb 12.8 oz (83.8 kg)  She has been taking red yeast rice and metamucil in the morning and night since before her last visit.       Past Medical History:  Diagnosis Date   Blood transfusion without reported diagnosis     15 years ago   Breast cancer ([place])    Heart murmur    Hypertension    Personal history of radiation therapy 2017     Family History  Problem Relation Age of Onset   Stroke Mother    Hypertension Mother    Stroke Father    Colon cancer Neg Hx    Esophageal cancer Neg Hx    Rectal cancer Neg Hx    Stomach cancer Neg Hx      Current Outpatient Medications:    Ascorbic Acid (VITAMIN C) 1000 MG tablet, Take 1,000 mg by mouth daily., Disp: , Rfl:    Multiple Vitamin (MULTIVITAMIN) tablet, Take 1 tablet by mouth daily., Disp: , Rfl:    olmesartan-hydrochlorothiazide (BENICAR HCT) 20-12.5 MG tablet, Take 1 tablet by mouth daily., Disp: 90 tablet, Rfl: 1   Omega-3 Fatty Acids (FISH OIL) 1000 MG CAPS, Take by mouth., Disp: , Rfl:    Allergies  Allergen Reactions   Adhesive [Tape] Other (See Comments)    Steri-strips cause blisters       The patient states she is status post hysterectomy. Negative for: breast discharge, breast lump(s), breast pain and breast self exam. Associated symptoms include abnormal  vaginal bleeding. Pertinent negatives include abnormal bleeding (hematology), anxiety, decreased libido, depression, difficulty falling sleep, dyspareunia, history of infertility, nocturia, sexual dysfunction, sleep disturbances, urinary incontinence, urinary urgency, vaginal discharge and vaginal itching. Diet: low carb diet to help her cholesterol.  The patient states her exercise level is moderate with strength training 3 days a week and cardio 2 times a week.   The patient's tobacco use is:  Social History   Tobacco Use  Smoking Status Never  Smokeless Tobacco Never  . She has been exposed to passive smoke. The patient's alcohol use is:  Social History   Substance and Sexual Activity  Alcohol Use No   Additional information: Last pap 2021, next one scheduled for none, has had a hysterectomy.    Review of Systems  Constitutional: Negative.   HENT: Negative.    Eyes: Negative.   Respiratory: Negative.    Cardiovascular: Negative.   Gastrointestinal: Negative.   Endocrine: Negative.   Genitourinary: Negative.   Musculoskeletal: Negative.   Skin: Negative.   Allergic/Immunologic: Negative.   Neurological: Negative.   Hematological: Negative.   Psychiatric/Behavioral: Negative.       Today's Vitals   02/05/22 0933  BP: 136/80  Pulse: 75  Temp:  98.3 F (36.8 C)  TempSrc: Oral  Weight: 184 lb (83.5 kg)  Height: '5\' 3"'$  (1.6 m)   Body mass index is 32.59 kg/m.  Wt Readings from Last 3 Encounters:  02/05/22 184 lb (83.5 kg)  12/05/21 186 lb 9.6 oz (84.6 kg)  08/06/21 184 lb 12.8 oz (83.8 kg)    Objective:  Physical Exam Vitals reviewed.  Constitutional:      General: She is not in acute distress.    Appearance: Normal appearance. She is well-developed. She is obese.  HENT:     Head: Normocephalic and atraumatic.     Right Ear: Hearing, tympanic membrane, ear canal and external ear normal. There is no impacted cerumen.     Left Ear: Hearing, tympanic membrane, ear  canal and external ear normal. There is no impacted cerumen.     Nose:     Comments: Deferred - masked    Mouth/Throat:     Comments: Deferred - masked Eyes:     General: Lids are normal.     Extraocular Movements: Extraocular movements intact.     Conjunctiva/sclera: Conjunctivae normal.     Pupils: Pupils are equal, round, and reactive to light.     Funduscopic exam:    Right eye: No papilledema.        Left eye: No papilledema.  Neck:     Thyroid: No thyroid mass.     Vascular: No carotid bruit.  Cardiovascular:     Rate and Rhythm: Normal rate and regular rhythm.     Pulses: Normal pulses.     Heart sounds: Normal heart sounds. No murmur heard. Pulmonary:     Effort: Pulmonary effort is normal. No respiratory distress.     Breath sounds: Normal breath sounds.  Chest:     Chest wall: No mass.  Breasts:    Tanner Score is 5.     Right: Normal. No mass or tenderness.     Left: Normal. No mass or tenderness.  Abdominal:     General: Abdomen is flat. Bowel sounds are normal. There is no distension.     Palpations: Abdomen is soft.     Tenderness: There is no abdominal tenderness.  Musculoskeletal:        General: No swelling. Normal range of motion.     Cervical back: Full passive range of motion without pain, normal range of motion and neck supple.     Right lower leg: No edema.     Left lower leg: No edema.  Lymphadenopathy:     Upper Body:     Right upper body: No supraclavicular, axillary or pectoral adenopathy.     Left upper body: No supraclavicular, axillary or pectoral adenopathy.  Skin:    General: Skin is warm and dry.     Capillary Refill: Capillary refill takes less than 2 seconds.  Neurological:     General: No focal deficit present.     Mental Status: She is alert and oriented to person, place, and time.     Cranial Nerves: No cranial nerve deficit.     Sensory: No sensory deficit.     Motor: No weakness.  Psychiatric:        Mood and Affect: Mood  normal.        Behavior: Behavior normal.        Thought Content: Thought content normal.        Judgment: Judgment normal.         Assessment And Plan:  1. Encounter for general adult medical examination w/o abnormal findings Behavior modifications discussed and diet history reviewed.   Pt will continue to exercise regularly and modify diet with low GI, plant based foods and decrease intake of processed foods.  Recommend intake of daily multivitamin, Vitamin D, and calcium.  Recommend mammogram and colonoscopy for preventive screenings, as well as recommend immunizations that include influenza, TDAP, and Shingles  2. Essential hypertension Comments: Blood pressure is controlled, continue current medications - POCT Urinalysis Dipstick (81002) - Microalbumin / Creatinine Urine Ratio - EKG 12-Lead - olmesartan-hydrochlorothiazide (BENICAR HCT) 20-12.5 MG tablet; Take 1 tablet by mouth daily.  Dispense: 90 tablet; Refill: 1  3. Prediabetes Comments: Stable, continue diet low in sugar and starches. Increase physical activity to at least 150 minutes a week - Hemoglobin A1c  4. Mixed hyperlipidemia Comments: Cholesterol levels are stable, continue with low fat diet - Lipid panel  5. Class 1 obesity due to excess calories without serious comorbidity with body mass index (BMI) of 32.0 to 32.9 in adult Chronic Discussed healthy diet and regular exercise options  Encouraged to exercise at least 150 minutes per week with 2 days of strength training  6. History of breast cancer Comments: Tumor removed and only had radiation last visit with Dr Lindi Adie in 2022  7. Other long term (current) drug therapy She is encouraged to strive for BMI less than 30 to decrease cardiac risk. Advised to aim for at least 150 minutes of exercise per week.  The 10-year ASCVD risk score (Arnett DK, et al., 2019) is: 4.8%   Values used to calculate the score:     Age: 28 years     Sex: Female     Is  Non-Hispanic African American: Yes     Diabetic: No     Tobacco smoker: No     Systolic Blood Pressure: 734 mmHg     Is BP treated: Yes     HDL Cholesterol: 67 mg/dL     Total Cholesterol: 228 mg/dL    Patient was given opportunity to ask questions. Patient verbalized understanding of the plan and was able to repeat key elements of the plan. All questions were answered to their satisfaction.   Minette Brine, FNP   I, Minette Brine, FNP, have reviewed all documentation for this visit. The documentation on 02/05/22 for the exam, diagnosis, procedures, and orders are all accurate and complete.   THE PATIENT IS ENCOURAGED TO PRACTICE SOCIAL DISTANCING DUE TO THE COVID-19 PANDEMIC.

## 2022-02-06 LAB — HEMOGLOBIN A1C
Est. average glucose Bld gHb Est-mCnc: 117 mg/dL
Hgb A1c MFr Bld: 5.7 % — ABNORMAL HIGH (ref 4.8–5.6)

## 2022-02-06 LAB — LIPID PANEL
Chol/HDL Ratio: 3.8 ratio (ref 0.0–4.4)
Cholesterol, Total: 242 mg/dL — ABNORMAL HIGH (ref 100–199)
HDL: 64 mg/dL (ref >39)
LDL Chol Calc (NIH): 169 mg/dL — ABNORMAL HIGH (ref 0–99)
Triglycerides: 54 mg/dL (ref 0–149)
VLDL Cholesterol Cal: 9 mg/dL (ref 5–40)

## 2022-02-07 LAB — MICROALBUMIN / CREATININE URINE RATIO
Creatinine, Urine: 48.9 mg/dL
Microalb/Creat Ratio: 6 mg/g creat (ref 0–29)
Microalbumin, Urine: 3 ug/mL

## 2022-02-20 DIAGNOSIS — H524 Presbyopia: Secondary | ICD-10-CM | POA: Diagnosis not present

## 2022-02-24 ENCOUNTER — Other Ambulatory Visit (HOSPITAL_COMMUNITY): Payer: Self-pay

## 2022-06-11 ENCOUNTER — Encounter: Payer: Self-pay | Admitting: Nurse Practitioner

## 2022-06-11 ENCOUNTER — Ambulatory Visit (INDEPENDENT_AMBULATORY_CARE_PROVIDER_SITE_OTHER): Payer: Commercial Managed Care - PPO | Admitting: Nurse Practitioner

## 2022-06-11 ENCOUNTER — Other Ambulatory Visit (HOSPITAL_COMMUNITY): Payer: Self-pay

## 2022-06-11 VITALS — BP 132/78 | HR 69 | Temp 99.0°F | Ht 63.0 in | Wt 190.0 lb

## 2022-06-11 DIAGNOSIS — E782 Mixed hyperlipidemia: Secondary | ICD-10-CM

## 2022-06-11 DIAGNOSIS — I10 Essential (primary) hypertension: Secondary | ICD-10-CM | POA: Diagnosis not present

## 2022-06-11 DIAGNOSIS — E6609 Other obesity due to excess calories: Secondary | ICD-10-CM

## 2022-06-11 DIAGNOSIS — Z6833 Body mass index (BMI) 33.0-33.9, adult: Secondary | ICD-10-CM

## 2022-06-11 DIAGNOSIS — R7303 Prediabetes: Secondary | ICD-10-CM

## 2022-06-11 LAB — LIPID PANEL
Chol/HDL Ratio: 3.1 ratio (ref 0.0–4.4)
Cholesterol, Total: 207 mg/dL — ABNORMAL HIGH (ref 100–199)
HDL: 67 mg/dL (ref >39)
LDL Chol Calc (NIH): 129 mg/dL — ABNORMAL HIGH (ref 0–99)
Triglycerides: 60 mg/dL (ref 0–149)
VLDL Cholesterol Cal: 11 mg/dL (ref 5–40)

## 2022-06-11 LAB — BMP8+EGFR
BUN/Creatinine Ratio: 16 (ref 9–23)
BUN: 11 mg/dL (ref 6–24)
CO2: 25 mmol/L (ref 20–29)
Calcium: 9.9 mg/dL (ref 8.7–10.2)
Chloride: 101 mmol/L (ref 96–106)
Creatinine, Ser: 0.7 mg/dL (ref 0.57–1.00)
Glucose: 90 mg/dL (ref 70–99)
Potassium: 4.7 mmol/L (ref 3.5–5.2)
Sodium: 141 mmol/L (ref 134–144)
eGFR: 103 mL/min/{1.73_m2} (ref >59)

## 2022-06-11 LAB — HEMOGLOBIN A1C
Est. average glucose Bld gHb Est-mCnc: 123 mg/dL
Hgb A1c MFr Bld: 5.9 % — ABNORMAL HIGH (ref 4.8–5.6)

## 2022-06-11 MED ORDER — OLMESARTAN MEDOXOMIL-HCTZ 20-12.5 MG PO TABS
1.0000 | ORAL_TABLET | Freq: Every day | ORAL | 1 refills | Status: DC
  Filled 2022-06-11 – 2022-08-26 (×2): qty 90, 90d supply, fill #0
  Filled 2022-11-24: qty 90, 90d supply, fill #1

## 2022-06-11 NOTE — Progress Notes (Signed)
I,[name],acting as a Education administrator for [employer], FNP.,have documented all relevant documentation on the behalf of Minette Brine, FNP,as directed by  Minette Brine, FNP while in the presence of Minette Brine, [place].  Subjective:     Patient ID: Erin Bowers , female    DOB: 29-Mar-1966 , 57 y.o.   MRN: [id number]   Chief Complaint  Patient presents with   Hypertension    HPI  Here for blood pressure check. She is has a treadmill.   The 10-year ASCVD risk score (Arnett DK, et al., 2019) is: 5.2%   Values used to calculate the score:     Age: 33 years     Sex: Female     Is Non-Hispanic African American: Yes     Diabetic: No     Tobacco smoker: No     Systolic Blood Pressure: 027 mmHg     Is BP treated: Yes     HDL Cholesterol: 64 mg/dL     Total Cholesterol: 242 mg/dL    Hypertension This is a chronic problem. The current episode started more than 1 year ago. The problem is unchanged. The problem is controlled. Pertinent negatives include no anxiety, chest pain, headaches or palpitations. There are no associated agents to hypertension. Risk factors for coronary artery disease include obesity. Past treatments include diuretics and angiotensin blockers. The current treatment provides significant improvement. There are no compliance problems.  There is no history of angina. There is no history of chronic renal disease.     Past Medical History:  Diagnosis Date   Blood transfusion without reported diagnosis     15 years ago   Breast cancer ([place])    Heart murmur    Hypertension    Personal history of radiation therapy 2017     Family History  Problem Relation Age of Onset   Stroke Mother    Hypertension Mother    Stroke Father    Colon cancer Neg Hx    Esophageal cancer Neg Hx    Rectal cancer Neg Hx    Stomach cancer Neg Hx      Current Outpatient Medications:    Ascorbic Acid (VITAMIN C) 1000 MG tablet, Take 1,000 mg by mouth daily., Disp: , Rfl:    Multiple  Vitamin (MULTIVITAMIN) tablet, Take 1 tablet by mouth daily., Disp: , Rfl:    Omega-3 Fatty Acids (FISH OIL) 1000 MG CAPS, Take by mouth., Disp: , Rfl:    olmesartan-hydrochlorothiazide (BENICAR HCT) 20-12.5 MG tablet, Take 1 tablet by mouth daily., Disp: 90 tablet, Rfl: 1   Allergies  Allergen Reactions   Adhesive [Tape] Other (See Comments)    Steri-strips cause blisters      Review of Systems  Constitutional: Negative.   Respiratory: Negative.    Cardiovascular: Negative.  Negative for chest pain, palpitations and leg swelling.  Gastrointestinal: Negative.   Neurological: Negative.  Negative for dizziness and headaches.  Psychiatric/Behavioral: Negative.       Today's Vitals   06/11/22 0848  BP: 132/78  Pulse: 69  Temp: 99 F (37.2 C)  TempSrc: Oral  Weight: 190 lb (86.2 kg)  Height: _0  (1.6 m)   Body mass index is 33.66 kg/m.  Wt Readings from Last 3 Encounters:  06/11/22 190 lb (86.2 kg)  02/05/22 184 lb (83.5 kg)  12/05/21 186 lb 9.6 oz (84.6 kg)    Objective:  Physical Exam Vitals reviewed.  Constitutional:      General: She is not in  acute distress.    Appearance: Normal appearance. She is obese.  HENT:     Head: Normocephalic.  Eyes:     Extraocular Movements: Extraocular movements intact.     Pupils: Pupils are equal, round, and reactive to light.  Cardiovascular:     Rate and Rhythm: Normal rate and regular rhythm.     Pulses: Normal pulses.     Heart sounds: Normal heart sounds. No murmur heard. Pulmonary:     Effort: Pulmonary effort is normal. No respiratory distress.     Breath sounds: Normal breath sounds. No wheezing.  Skin:    General: Skin is warm and dry.     Capillary Refill: Capillary refill takes less than 2 seconds.  Neurological:     General: No focal deficit present.     Mental Status: She is alert and oriented to person, place, and time.     Cranial Nerves: No cranial nerve deficit.     Motor: No weakness.  Psychiatric:         Mood and Affect: Mood normal.        Behavior: Behavior normal.        Thought Content: Thought content normal.        Judgment: Judgment normal.         Assessment And Plan:     1. Essential hypertension Comments: Blood pressure is well controlled, continue current medications - BMP8+EGFR - olmesartan-hydrochlorothiazide (BENICAR HCT) 20-12.5 MG tablet; Take 1 tablet by mouth daily.  Dispense: 90 tablet; Refill: 1  2. Prediabetes Comments: Diet controlled, continue focusing on healthy diet and regular exercise of at least 150 minutes per week - Hemoglobin A1c  3. Mixed hyperlipidemia Comments: Cholesterol levels more elevated at last visit, ASCVD risk score is 5.2%. If cholesterol levels are more elevated will order Calcium Scoring, aware is cash pay - Lipid panel  4. Class 1 obesity due to excess calories with serious comorbidity and body mass index (BMI) of 33.0 to 33.9 in adult She is encouraged to strive for BMI less than 30 to decrease cardiac risk. Advised to aim for at least 150 minutes of exercise per week.    Patient was given opportunity to ask questions. Patient verbalized understanding of the plan and was able to repeat key elements of the plan. All questions were answered to their satisfaction.  Minette Brine, FNP   I, Minette Brine, FNP, have reviewed all documentation for this visit. The documentation on 06/11/22 for the exam, diagnosis, procedures, and orders are all accurate and complete.   IF YOU HAVE BEEN REFERRED TO A SPECIALIST, IT MAY TAKE 1-2 WEEKS TO SCHEDULE/PROCESS THE REFERRAL. IF YOU HAVE NOT HEARD FROM US/SPECIALIST IN TWO WEEKS, PLEASE GIVE [place] A CALL AT [phone].   THE PATIENT IS ENCOURAGED TO PRACTICE SOCIAL DISTANCING DUE TO THE COVID-19 PANDEMIC.

## 2022-06-11 NOTE — Patient Instructions (Signed)
Hypertension, Adult High blood pressure (hypertension) is when the force of blood pumping through the arteries is too strong. The arteries are the blood vessels that carry blood from the heart throughout the body. Hypertension forces the heart to work harder to pump blood and may cause arteries to become narrow or stiff. Untreated or uncontrolled hypertension can lead to a heart attack, heart failure, a stroke, kidney disease, and other problems. A blood pressure reading consists of a higher number over a lower number. Ideally, your blood pressure should be below 120/80. The first ("top") number is called the systolic pressure. It is a measure of the pressure in your arteries as your heart beats. The second ("bottom") number is called the diastolic pressure. It is a measure of the pressure in your arteries as the heart relaxes. What are the causes? The exact cause of this condition is not known. There are some conditions that result in high blood pressure. What increases the risk? Certain factors may make you more likely to develop high blood pressure. Some of these risk factors are under your control, including: Smoking. Not getting enough exercise or physical activity. Being overweight. Having too much fat, sugar, calories, or salt (sodium) in your diet. Drinking too much alcohol. Other risk factors include: Having a personal history of heart disease, diabetes, high cholesterol, or kidney disease. Stress. Having a family history of high blood pressure and high cholesterol. Having obstructive sleep apnea. Age. The risk increases with age. What are the signs or symptoms? High blood pressure may not cause symptoms. Very high blood pressure (hypertensive crisis) may cause: Headache. Fast or irregular heartbeats (palpitations). Shortness of breath. Nosebleed. Nausea and vomiting. Vision changes. Severe chest pain, dizziness, and seizures. How is this diagnosed? This condition is diagnosed by  measuring your blood pressure while you are seated, with your arm resting on a flat surface, your legs uncrossed, and your feet flat on the floor. The cuff of the blood pressure monitor will be placed directly against the skin of your upper arm at the level of your heart. Blood pressure should be measured at least twice using the same arm. Certain conditions can cause a difference in blood pressure between your right and left arms. If you have a high blood pressure reading during one visit or you have normal blood pressure with other risk factors, you may be asked to: Return on a different day to have your blood pressure checked again. Monitor your blood pressure at home for 1 week or longer. If you are diagnosed with hypertension, you may have other blood or imaging tests to help your health care provider understand your overall risk for other conditions. How is this treated? This condition is treated by making healthy lifestyle changes, such as eating healthy foods, exercising more, and reducing your alcohol intake. You may be referred for counseling on a healthy diet and physical activity. Your health care provider may prescribe medicine if lifestyle changes are not enough to get your blood pressure under control and if: Your systolic blood pressure is above 130. Your diastolic blood pressure is above 80. Your personal target blood pressure may vary depending on your medical conditions, your age, and other factors. Follow these instructions at home: Eating and drinking  Eat a diet that is high in fiber and potassium, and low in sodium, added sugar, and fat. An example of this eating plan is called the DASH diet. DASH stands for Dietary Approaches to Stop Hypertension. To eat this way: Eat   plenty of fresh fruits and vegetables. Try to fill one half of your plate at each meal with fruits and vegetables. Eat whole grains, such as whole-wheat pasta, brown rice, or whole-grain bread. Fill about one  fourth of your plate with whole grains. Eat or drink low-fat dairy products, such as skim milk or low-fat yogurt. Avoid fatty cuts of meat, processed or cured meats, and poultry with skin. Fill about one fourth of your plate with lean proteins, such as fish, chicken without skin, beans, eggs, or tofu. Avoid pre-made and processed foods. These tend to be higher in sodium, added sugar, and fat. Reduce your daily sodium intake. Many people with hypertension should eat less than 1,500 mg of sodium a day. Do not drink alcohol if: Your health care provider tells you not to drink. You are pregnant, may be pregnant, or are planning to become pregnant. If you drink alcohol: Limit how much you have to: 0-1 drink a day for women. 0-2 drinks a day for men. Know how much alcohol is in your drink. In the [place] one drink equals one 12 oz bottle of beer (355 mL), one 5 oz glass of wine (148 mL), or one 1 oz glass of hard liquor (44 mL). Lifestyle  Work with your health care provider to maintain a healthy body weight or to lose weight. Ask what an ideal weight is for you. Get at least 30 minutes of exercise that causes your heart to beat faster (aerobic exercise) most days of the week. Activities may include walking, swimming, or biking. Include exercise to strengthen your muscles (resistance exercise), such as Pilates or lifting weights, as part of your weekly exercise routine. Try to do these types of exercises for 30 minutes at least 3 days a week. Do not use any products that contain nicotine or tobacco. These products include cigarettes, chewing tobacco, and vaping devices, such as e-cigarettes. If you need help quitting, ask your health care provider. Monitor your blood pressure at home as told by your health care provider. Keep all follow-up visits. This is important. Medicines Take over-the-counter and prescription medicines only as told by your health care provider. Follow directions carefully. Blood  pressure medicines must be taken as prescribed. Do not skip doses of blood pressure medicine. Doing this puts you at risk for problems and can make the medicine less effective. Ask your health care provider about side effects or reactions to medicines that you should watch for. Contact a health care provider if you: Think you are having a reaction to a medicine you are taking. Have headaches that keep coming back (recurring). Feel dizzy. Have swelling in your ankles. Have trouble with your vision. Get help right away if you: Develop a severe headache or confusion. Have unusual weakness or numbness. Feel faint. Have severe pain in your chest or abdomen. Vomit repeatedly. Have trouble breathing. These symptoms may be an emergency. Get help right away. Call 911. Do not wait to see if the symptoms will go away. Do not drive yourself to the hospital. Summary Hypertension is when the force of blood pumping through your arteries is too strong. If this condition is not controlled, it may put you at risk for serious complications. Your personal target blood pressure may vary depending on your medical conditions, your age, and other factors. For most people, a normal blood pressure is less than 120/80. Hypertension is treated with lifestyle changes, medicines, or a combination of both. Lifestyle changes include losing weight, eating a healthy,   low-sodium diet, exercising more, and limiting alcohol. This information is not intended to replace advice given to you by your health care provider. Make sure you discuss any questions you have with your health care provider. Document Revised: 04/02/2021 Document Reviewed: 04/02/2021 Elsevier Patient Education  2023 [employer]  

## 2022-07-02 ENCOUNTER — Other Ambulatory Visit: Payer: Self-pay | Admitting: Nurse Practitioner

## 2022-07-02 DIAGNOSIS — Z1231 Encounter for screening mammogram for malignant neoplasm of breast: Secondary | ICD-10-CM

## 2022-08-25 ENCOUNTER — Ambulatory Visit
Admission: RE | Admit: 2022-08-25 | Discharge: 2022-08-25 | Disposition: A | Discharge status: Home / Self Care | Payer: Commercial Managed Care - PPO | Source: Ambulatory Visit | Attending: Nurse Practitioner | Admitting: Nurse Practitioner

## 2022-08-25 DIAGNOSIS — Z1231 Encounter for screening mammogram for malignant neoplasm of breast: Secondary | ICD-10-CM | POA: Diagnosis not present

## 2022-08-26 ENCOUNTER — Other Ambulatory Visit (HOSPITAL_COMMUNITY): Payer: Self-pay

## 2022-08-26 ENCOUNTER — Other Ambulatory Visit: Payer: Self-pay

## 2022-10-13 ENCOUNTER — Ambulatory Visit: Payer: Commercial Managed Care - PPO | Admitting: Nurse Practitioner

## 2022-10-14 ENCOUNTER — Ambulatory Visit: Payer: Commercial Managed Care - PPO | Admitting: Nurse Practitioner

## 2022-10-15 ENCOUNTER — Ambulatory Visit: Payer: Commercial Managed Care - PPO | Admitting: Nurse Practitioner

## 2023-02-02 IMAGING — US US ABDOMEN LIMITED
1 series · 14 of 25 positions shown · non-contrast
Comparison: None.

CLINICAL DATA: Increased liver function tests.

EXAM:
ULTRASOUND ABDOMEN LIMITED RIGHT UPPER QUADRANT

[Series 1: us abdomen limited · 0.19mm/px · 14 of 51 slices shown]
[im 1/51]
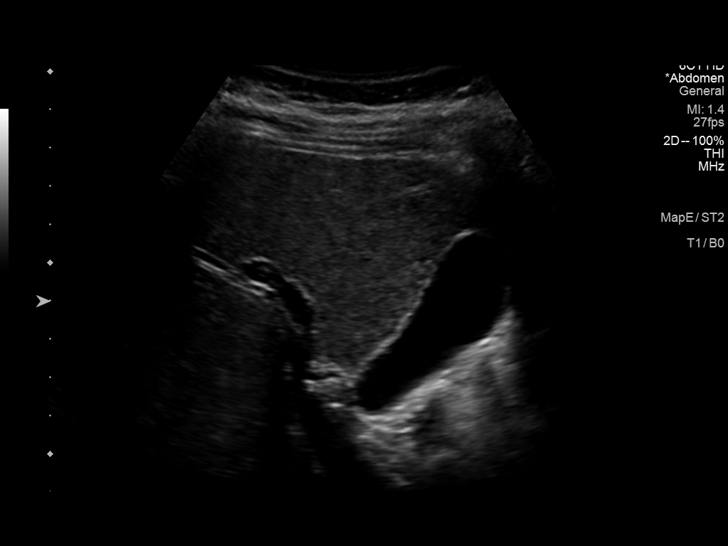
[im 5/51]
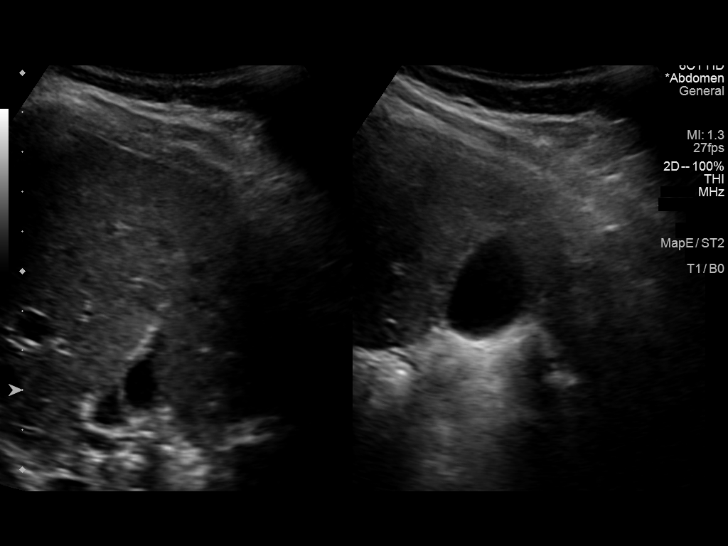
[im 9/51]
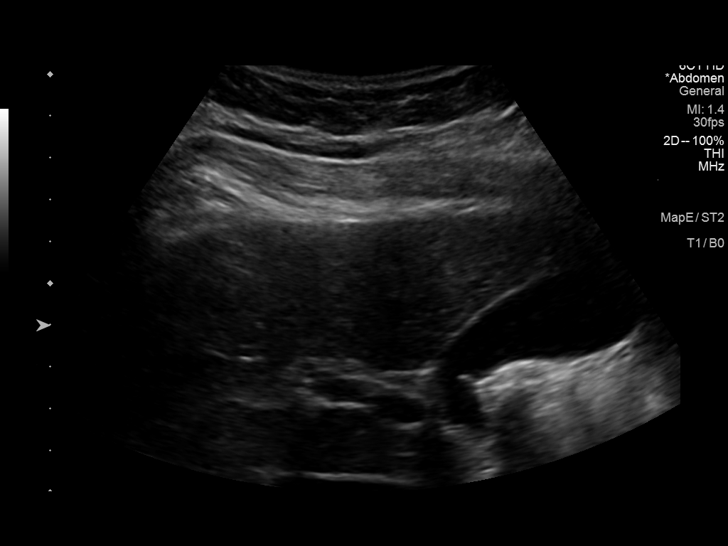
[im 13/51]
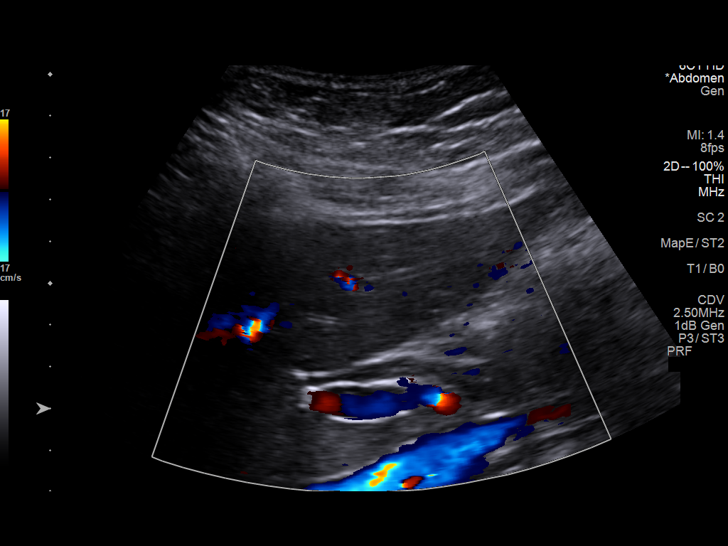
[im 17/51]
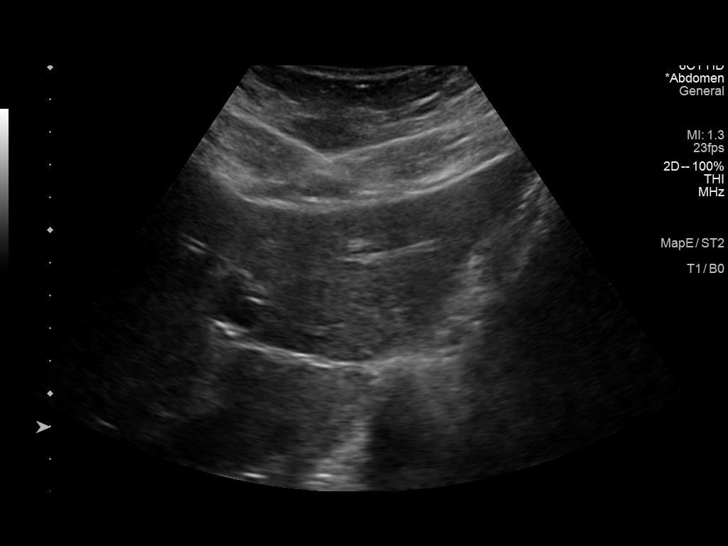
[im 19/51]
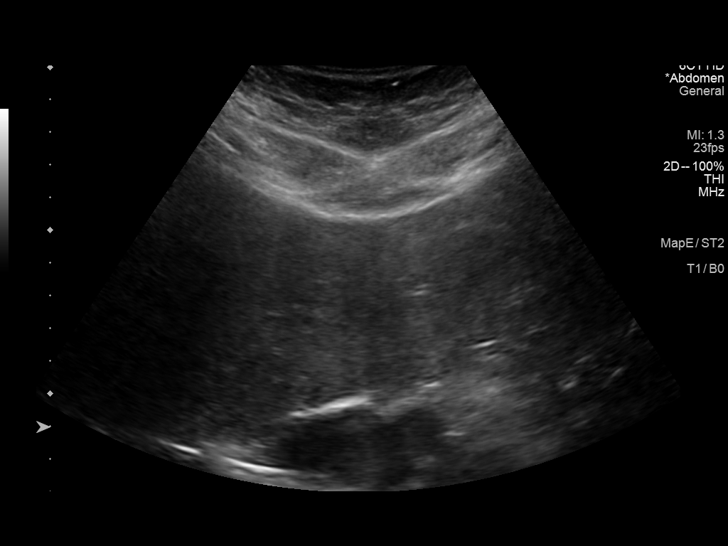
[im 23/51]
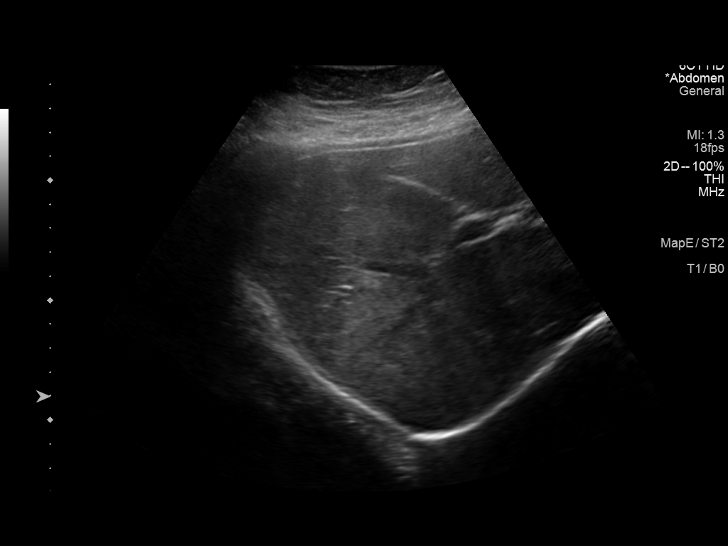
[im 28/51]
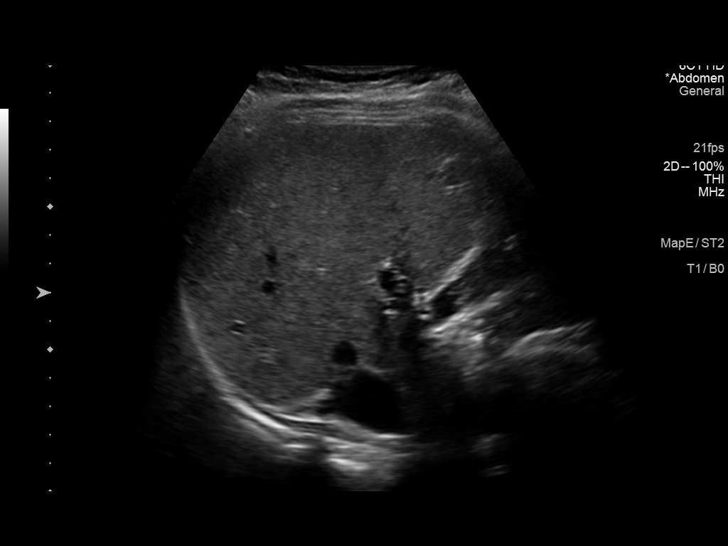
[im 32/51]
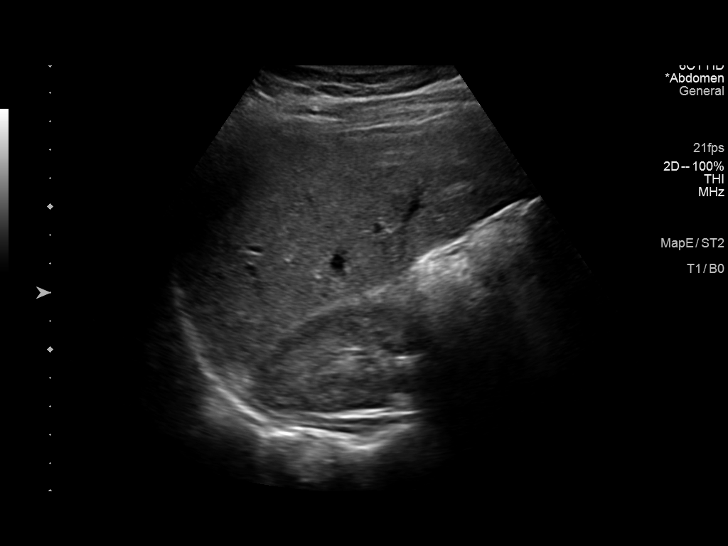
[im 34/51]
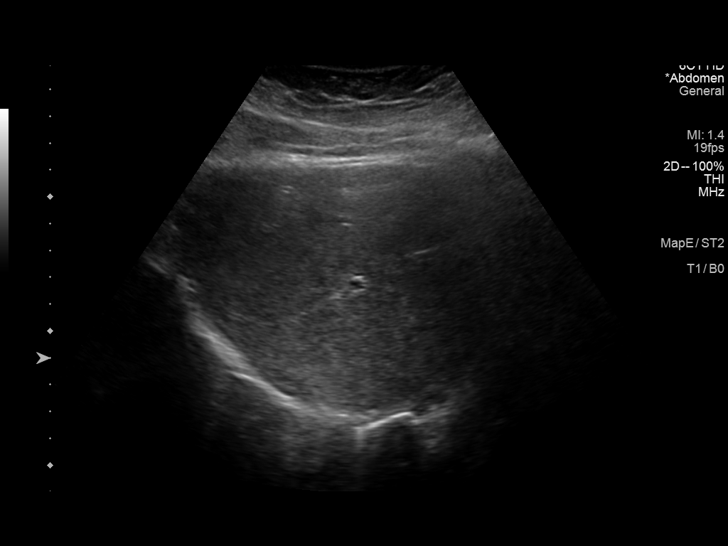
[im 38/51]
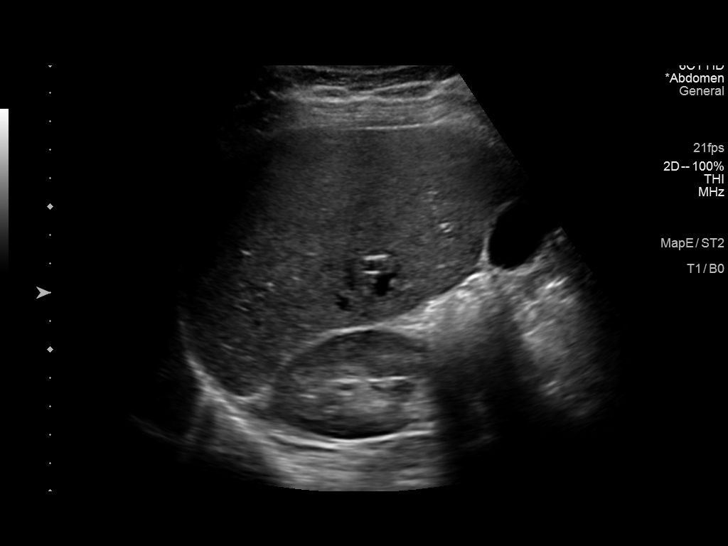
[im 42/51]
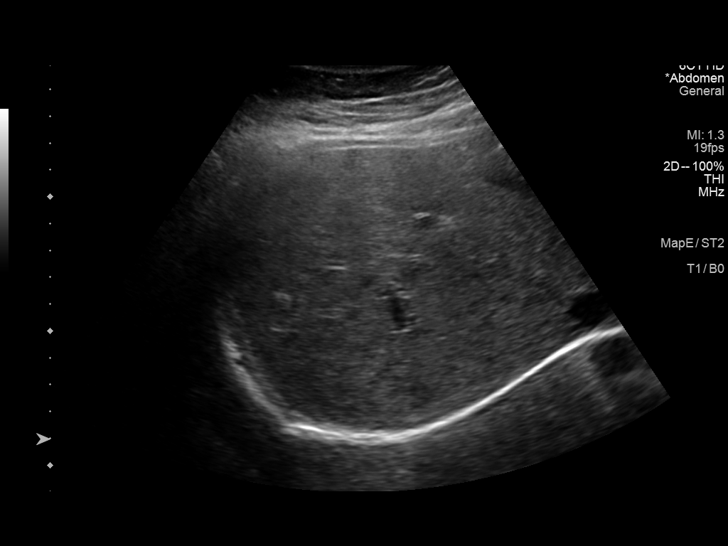
[im 46/51]
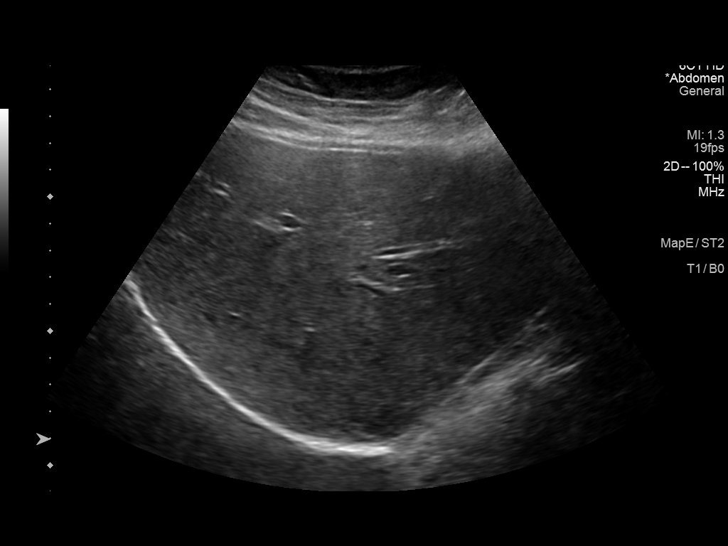
[im 51/51]
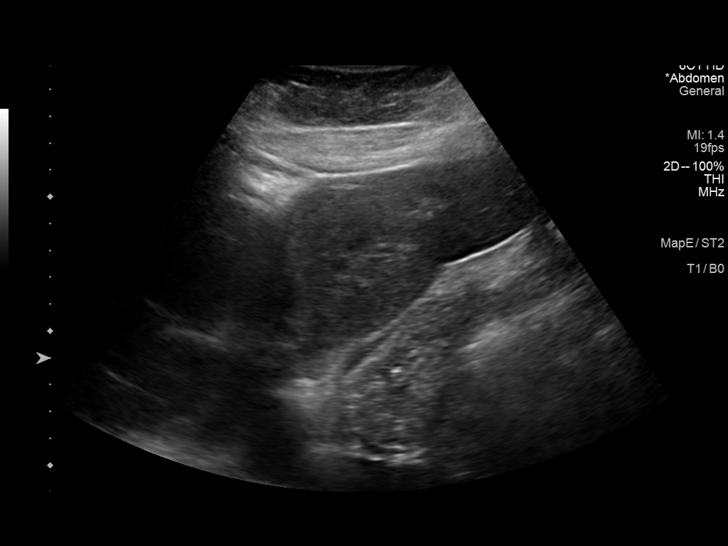

[14 of 25 positions shown; findings below may reference images not displayed]

FINDINGS: Gallbladder:

No gallstones or wall thickening visualized. No sonographic Murphy
sign noted by sonographer.

Common bile duct:

Diameter: 4 mm

Liver:

No focal lesion identified. Within normal limits in parenchymal
echogenicity. Portal vein is patent on color Doppler imaging with
normal direction of blood flow towards the liver.

Other: None.
IMPRESSION: No biliary ductal dilatation.

## 2023-02-10 NOTE — Progress Notes (Signed)
Madelaine Bhat, CMA,acting as a Neurosurgeon for Arnette Felts, FNP.,have documented all relevant documentation on the behalf of Arnette Felts, FNP,as directed by  Arnette Felts, FNP while in the presence of Arnette Felts, FNP.  Subjective:    Patient ID: Erin Bowers , female    DOB: 1966-01-05 , 57 y.o.   MRN: [id number]  Chief Complaint  Patient presents with   Annual Exam    HPI  Patient presents today for HM, Patient reports compliance with medications; she did take her BP medications this morning right before coming in for her appt. Patient denies any Chest pain, SOB, and headaches. Patient has no concerns today.   BP Readings from Last 3 Encounters: 02/11/23 : (!) 140/80 06/11/22 : 132/78 02/05/22 : 136/80       Past Medical History:  Diagnosis Date   Blood transfusion without reported diagnosis     15 years ago   Breast cancer (HCC)    Heart murmur    Hypertension    Personal history of radiation therapy 2017     Family History  Problem Relation Age of Onset   Stroke Mother    Hypertension Mother    Stroke Father    Colon cancer Neg Hx    Esophageal cancer Neg Hx    Rectal cancer Neg Hx    Stomach cancer Neg Hx      Current Outpatient Medications:    Ascorbic Acid (VITAMIN C) 1000 MG tablet, Take 1,000 mg by mouth daily., Disp: , Rfl:    Multiple Vitamin (MULTIVITAMIN) tablet, Take 1 tablet by mouth daily., Disp: , Rfl:    Omega-3 Fatty Acids (FISH OIL) 1000 MG CAPS, Take by mouth., Disp: , Rfl:    olmesartan-hydrochlorothiazide (BENICAR HCT) 20-12.5 MG tablet, Take 1 tablet by mouth daily., Disp: 90 tablet, Rfl: 1   Allergies  Allergen Reactions   Adhesive [Tape] Other (See Comments)    Steri-strips cause blisters       The patient states she uses status post hysterectomy for birth control. No LMP recorded. Patient has had a hysterectomy.  Negative for: breast discharge, breast lump(s), breast pain and breast self exam. Associated symptoms include abnormal  vaginal bleeding. Pertinent negatives include abnormal bleeding (hematology), anxiety, decreased libido, depression, difficulty falling sleep, dyspareunia, history of infertility, nocturia, sexual dysfunction, sleep disturbances, urinary incontinence, urinary urgency, vaginal discharge and vaginal itching. Diet regular; feels like it has been decent eating more fruits and vegetables.The patient states her exercise level is minimal - off and on sometimes average 2 times a week with walking. No strength training.   The patient's tobacco use is:  Social History   Tobacco Use  Smoking Status Never  Smokeless Tobacco Never  . She has been exposed to passive smoke. The patient's alcohol use is:  Social History   Substance and Sexual Activity  Alcohol Use No  Additional information: Last pap 2019, Dr Seymour Bars advised did not need to have any further PAPs due to not being sexually active.   Review of Systems  Constitutional: Negative.   HENT: Negative.    Eyes: Negative.   Respiratory: Negative.    Cardiovascular: Negative.   Gastrointestinal: Negative.   Endocrine: Negative.   Genitourinary: Negative.   Musculoskeletal: Negative.   Skin: Negative.   Allergic/Immunologic: Negative.   Neurological: Negative.   Hematological: Negative.   Psychiatric/Behavioral: Negative.       Today's Vitals   02/11/23 0953 02/11/23 1038  BP: (!) 140/80 130/80  Pulse:  71   Temp: 98.5 F (36.9 C)   TempSrc: Oral   Weight: 192 lb 6.4 oz (87.3 kg)   Height: 5\' 3"  (1.6 m)   PainSc: 0-No pain    Body mass index is 34.08 kg/m.  Wt Readings from Last 3 Encounters:  02/11/23 192 lb 6.4 oz (87.3 kg)  06/11/22 190 lb (86.2 kg)  02/05/22 184 lb (83.5 kg)     Objective:  Physical Exam Vitals reviewed.  Constitutional:      General: She is not in acute distress.    Appearance: Normal appearance. She is well-developed. She is obese.  HENT:     Head: Normocephalic and atraumatic.     Right Ear:  Hearing, tympanic membrane, ear canal and external ear normal. There is no impacted cerumen.     Left Ear: Hearing, tympanic membrane, ear canal and external ear normal. There is no impacted cerumen.     Nose:     Comments: Deferred - masked    Mouth/Throat:     Comments: Deferred - masked Eyes:     General: Lids are normal.     Extraocular Movements: Extraocular movements intact.     Conjunctiva/sclera: Conjunctivae normal.     Pupils: Pupils are equal, round, and reactive to light.     Funduscopic exam:    Right eye: No papilledema.        Left eye: No papilledema.  Neck:     Thyroid: No thyroid mass.     Vascular: No carotid bruit.  Cardiovascular:     Rate and Rhythm: Normal rate and regular rhythm.     Pulses: Normal pulses.     Heart sounds: Normal heart sounds. No murmur heard. Pulmonary:     Effort: Pulmonary effort is normal. No respiratory distress.     Breath sounds: Normal breath sounds.  Chest:     Chest wall: No mass.  Breasts:    Tanner Score is 5.     Right: Normal. No mass or tenderness.     Left: Normal. No mass or tenderness.  Abdominal:     General: Abdomen is flat. Bowel sounds are normal. There is no distension.     Palpations: Abdomen is soft.     Tenderness: There is no abdominal tenderness.  Musculoskeletal:        General: No swelling. Normal range of motion.     Cervical back: Full passive range of motion without pain, normal range of motion and neck supple.     Right lower leg: No edema.     Left lower leg: No edema.  Lymphadenopathy:     Upper Body:     Right upper body: No supraclavicular, axillary or pectoral adenopathy.     Left upper body: No supraclavicular, axillary or pectoral adenopathy.  Skin:    General: Skin is warm and dry.     Capillary Refill: Capillary refill takes less than 2 seconds.  Neurological:     General: No focal deficit present.     Mental Status: She is alert and oriented to person, place, and time.     Cranial  Nerves: No cranial nerve deficit.     Sensory: No sensory deficit.     Motor: No weakness.  Psychiatric:        Mood and Affect: Mood normal.        Behavior: Behavior normal.        Thought Content: Thought content normal.        Judgment:  Judgment normal.         Assessment And Plan:     Encounter for annual health examination Assessment & Plan: Behavior modifications discussed and diet history reviewed.   Pt will continue to exercise regularly and modify diet with low GI, plant based foods and decrease intake of processed foods.  Recommend intake of daily multivitamin, Vitamin D, and calcium.  Recommend mammogram and colonoscopy for preventive screenings, as well as recommend immunizations that include influenza, TDAP, and Shingles    Essential hypertension Assessment & Plan: Blood pressure is fairly controlled.  Blood pressure is improved with second check.  Continue focusing on lifestyle changes.  EKG done heart rate 68 sinus rhythm  Orders: -     EKG 12-Lead -     POCT URINALYSIS DIP (CLINITEK) -     Microalbumin / creatinine urine ratio -     CMP14+EGFR  Mixed hyperlipidemia Assessment & Plan: Cholesterol levels are stable.  Will check lipid panel.  Continue to focus on low-fat diet.  Orders: -     Lipid panel  Prediabetes Assessment & Plan: Hemoglobin A1c is stable.  Encouraged to continue limiting intake of sugary foods and drinks.  Orders: -     Hemoglobin A1c  Influenza vaccination declined Assessment & Plan: Will obtain at work   Class 1 obesity due to excess calories with body mass index (BMI) of 34.0 to 34.9 in adult, unspecified whether serious comorbidity present Assessment & Plan: She is encouraged to strive for BMI less than 30 to decrease cardiac risk. Advised to aim for at least 150 minutes of exercise per week.  Encouraged to increase physical activity by at least 1 day.    History of breast cancer  Other long term (current) drug  therapy -     CBC with Differential/Platelet     Return for 1 year physical, 6 month bp check. Patient was given opportunity to ask questions. Patient verbalized understanding of the plan and was able to repeat key elements of the plan. All questions were answered to their satisfaction.   Arnette Felts, FNP  I, Arnette Felts, FNP, have reviewed all documentation for this visit. The documentation on 02/11/23 for the exam, diagnosis, procedures, and orders are all accurate and complete.

## 2023-02-11 ENCOUNTER — Encounter: Payer: Self-pay | Admitting: Nurse Practitioner

## 2023-02-11 ENCOUNTER — Ambulatory Visit: Payer: Commercial Managed Care - PPO | Admitting: Nurse Practitioner

## 2023-02-11 VITALS — BP 130/80 | HR 71 | Temp 98.5°F | Ht 63.0 in | Wt 192.4 lb

## 2023-02-11 DIAGNOSIS — R7303 Prediabetes: Secondary | ICD-10-CM | POA: Diagnosis not present

## 2023-02-11 DIAGNOSIS — Z853 Personal history of malignant neoplasm of breast: Secondary | ICD-10-CM | POA: Diagnosis not present

## 2023-02-11 DIAGNOSIS — Z2821 Immunization not carried out because of patient refusal: Secondary | ICD-10-CM | POA: Diagnosis not present

## 2023-02-11 DIAGNOSIS — Z6834 Body mass index (BMI) 34.0-34.9, adult: Secondary | ICD-10-CM | POA: Diagnosis not present

## 2023-02-11 DIAGNOSIS — Z0000 Encounter for general adult medical examination without abnormal findings: Secondary | ICD-10-CM | POA: Diagnosis not present

## 2023-02-11 DIAGNOSIS — E6609 Other obesity due to excess calories: Secondary | ICD-10-CM | POA: Diagnosis not present

## 2023-02-11 DIAGNOSIS — I10 Essential (primary) hypertension: Secondary | ICD-10-CM | POA: Diagnosis not present

## 2023-02-11 DIAGNOSIS — Z79899 Other long term (current) drug therapy: Secondary | ICD-10-CM | POA: Diagnosis not present

## 2023-02-11 DIAGNOSIS — E782 Mixed hyperlipidemia: Secondary | ICD-10-CM

## 2023-02-11 LAB — POCT URINALYSIS DIP (CLINITEK)
Bilirubin, UA: NEGATIVE
Blood, UA: NEGATIVE
Glucose, UA: NEGATIVE mg/dL
Ketones, POC UA: NEGATIVE mg/dL
Leukocytes, UA: NEGATIVE
Nitrite, UA: NEGATIVE
POC PROTEIN,UA: NEGATIVE
Spec Grav, UA: 1.015 (ref 1.010–1.025)
Urobilinogen, UA: 0.2 U/dL
pH, UA: 6.5 (ref 5.0–8.0)

## 2023-02-11 NOTE — Patient Instructions (Addendum)
Health Maintenance  Topic Date Due   COVID-19 Vaccine (4 - 2023-24 season) 02/08/2023   Flu Shot  09/07/2023*   Mammogram  08/24/2024   Colon Cancer Screening  08/15/2028   DTaP/Tdap/Td vaccine (2 - Td or Tdap) 01/05/2029   Hepatitis C Screening  Completed   HIV Screening  Completed   Zoster (Shingles) Vaccine  Completed   HPV Vaccine  Aged Out   Pap Smear  Discontinued  *Topic was postponed. The date shown is not the original due date.   Goal to exercise 150 minutes per week with at least 2 days of strength training I encourage you to park further when at the store, take stairs instead of elevators and to walk in place during commercials. Drink at least one gallon of water a day

## 2023-02-12 LAB — CBC WITH DIFFERENTIAL/PLATELET
Basophils Absolute: 0 10*3/uL (ref 0.0–0.2)
Basos: 1 %
EOS (ABSOLUTE): 0.1 10*3/uL (ref 0.0–0.4)
Eos: 3 %
Hematocrit: 41.3 % (ref 34.0–46.6)
Hemoglobin: 13.3 g/dL (ref 11.1–15.9)
Immature Grans (Abs): 0 10*3/uL (ref 0.0–0.1)
Immature Granulocytes: 0 %
Lymphocytes Absolute: 2.3 10*3/uL (ref 0.7–3.1)
Lymphs: 43 %
MCH: 27.1 pg (ref 26.6–33.0)
MCHC: 32.2 g/dL (ref 31.5–35.7)
MCV: 84 fL (ref 79–97)
Monocytes Absolute: 0.5 10*3/uL (ref 0.1–0.9)
Monocytes: 10 %
Neutrophils Absolute: 2.4 10*3/uL (ref 1.4–7.0)
Neutrophils: 43 %
Platelets: 281 10*3/uL (ref 150–450)
RBC: 4.9 x10E6/uL (ref 3.77–5.28)
RDW: 13.3 % (ref 11.7–15.4)
WBC: 5.5 10*3/uL (ref 3.4–10.8)

## 2023-02-12 LAB — LIPID PANEL
Chol/HDL Ratio: 3.7 ratio (ref 0.0–4.4)
Cholesterol, Total: 227 mg/dL — ABNORMAL HIGH (ref 100–199)
HDL: 62 mg/dL (ref >39)
LDL Chol Calc (NIH): 151 mg/dL — ABNORMAL HIGH (ref 0–99)
Triglycerides: 81 mg/dL (ref 0–149)
VLDL Cholesterol Cal: 14 mg/dL (ref 5–40)

## 2023-02-12 LAB — CMP14+EGFR
ALT: 27 IU/L (ref 0–32)
AST: 24 IU/L (ref 0–40)
Albumin: 4.6 g/dL (ref 3.8–4.9)
Alkaline Phosphatase: 106 IU/L (ref 44–121)
BUN/Creatinine Ratio: 17 (ref 9–23)
BUN: 12 mg/dL (ref 6–24)
Bilirubin Total: 0.3 mg/dL (ref 0.0–1.2)
CO2: 26 mmol/L (ref 20–29)
Calcium: 10.2 mg/dL (ref 8.7–10.2)
Chloride: 99 mmol/L (ref 96–106)
Creatinine, Ser: 0.7 mg/dL (ref 0.57–1.00)
Globulin, Total: 2.8 g/dL (ref 1.5–4.5)
Glucose: 79 mg/dL (ref 70–99)
Potassium: 4.3 mmol/L (ref 3.5–5.2)
Sodium: 139 mmol/L (ref 134–144)
Total Protein: 7.4 g/dL (ref 6.0–8.5)
eGFR: 103 mL/min/{1.73_m2} (ref >59)

## 2023-02-12 LAB — MICROALBUMIN / CREATININE URINE RATIO
Creatinine, Urine: 77.4 mg/dL
Microalb/Creat Ratio: 13 mg/g{creat} (ref 0–29)
Microalbumin, Urine: 10.1 ug/mL

## 2023-02-12 LAB — HEMOGLOBIN A1C
Est. average glucose Bld gHb Est-mCnc: 128 mg/dL
Hgb A1c MFr Bld: 6.1 % — ABNORMAL HIGH (ref 4.8–5.6)

## 2023-02-24 ENCOUNTER — Other Ambulatory Visit: Payer: Self-pay | Admitting: Nurse Practitioner

## 2023-02-24 ENCOUNTER — Other Ambulatory Visit (HOSPITAL_COMMUNITY): Payer: Self-pay

## 2023-02-24 DIAGNOSIS — I10 Essential (primary) hypertension: Secondary | ICD-10-CM

## 2023-02-24 MED ORDER — OLMESARTAN MEDOXOMIL-HCTZ 20-12.5 MG PO TABS
1.0000 | ORAL_TABLET | Freq: Every day | ORAL | 1 refills | Status: DC
  Filled 2023-02-24: qty 90, 90d supply, fill #0
  Filled 2023-05-27: qty 90, 90d supply, fill #1

## 2023-02-27 ENCOUNTER — Other Ambulatory Visit (HOSPITAL_COMMUNITY): Payer: Self-pay

## 2023-03-01 DIAGNOSIS — Z0000 Encounter for general adult medical examination without abnormal findings: Secondary | ICD-10-CM | POA: Insufficient documentation

## 2023-03-01 DIAGNOSIS — Z2821 Immunization not carried out because of patient refusal: Secondary | ICD-10-CM | POA: Insufficient documentation

## 2023-03-01 DIAGNOSIS — R7303 Prediabetes: Secondary | ICD-10-CM | POA: Insufficient documentation

## 2023-03-01 DIAGNOSIS — E782 Mixed hyperlipidemia: Secondary | ICD-10-CM | POA: Insufficient documentation

## 2023-03-01 DIAGNOSIS — E6609 Other obesity due to excess calories: Secondary | ICD-10-CM | POA: Insufficient documentation

## 2023-03-01 DIAGNOSIS — Z853 Personal history of malignant neoplasm of breast: Secondary | ICD-10-CM | POA: Insufficient documentation

## 2023-03-01 NOTE — Assessment & Plan Note (Addendum)
She is encouraged to strive for BMI less than 30 to decrease cardiac risk. Advised to aim for at least 150 minutes of exercise per week.  Encouraged to increase physical activity by at least 1 day.

## 2023-03-01 NOTE — Assessment & Plan Note (Signed)
Behavior modifications discussed and diet history reviewed.   Pt will continue to exercise regularly and modify diet with low GI, plant based foods and decrease intake of processed foods.  Recommend intake of daily multivitamin, Vitamin D, and calcium.  Recommend mammogram and colonoscopy for preventive screenings, as well as recommend immunizations that include influenza, TDAP, and Shingles  

## 2023-03-01 NOTE — Assessment & Plan Note (Addendum)
Blood pressure is fairly controlled.  Blood pressure is improved with second check.  Continue focusing on lifestyle changes.  EKG done heart rate 68 sinus rhythm

## 2023-03-01 NOTE — Assessment & Plan Note (Signed)
Hemoglobin A1c is stable.  Encouraged to continue limiting intake of sugary foods and drinks.

## 2023-03-01 NOTE — Assessment & Plan Note (Signed)
Will obtain at work

## 2023-03-01 NOTE — Assessment & Plan Note (Signed)
Cholesterol levels are stable.  Will check lipid panel.  Continue to focus on low-fat diet.

## 2023-08-04 ENCOUNTER — Other Ambulatory Visit: Payer: Self-pay | Admitting: Nurse Practitioner

## 2023-08-04 DIAGNOSIS — Z1231 Encounter for screening mammogram for malignant neoplasm of breast: Secondary | ICD-10-CM

## 2023-08-11 ENCOUNTER — Ambulatory Visit: Payer: Commercial Managed Care - PPO | Admitting: Nurse Practitioner

## 2023-08-11 ENCOUNTER — Encounter: Payer: Self-pay | Admitting: Nurse Practitioner

## 2023-08-11 ENCOUNTER — Other Ambulatory Visit (HOSPITAL_COMMUNITY): Payer: Self-pay

## 2023-08-11 VITALS — BP 140/90 | HR 65 | Temp 99.3°F | Ht 63.0 in | Wt 191.0 lb

## 2023-08-11 DIAGNOSIS — Z79899 Other long term (current) drug therapy: Secondary | ICD-10-CM | POA: Diagnosis not present

## 2023-08-11 DIAGNOSIS — Z2821 Immunization not carried out because of patient refusal: Secondary | ICD-10-CM

## 2023-08-11 DIAGNOSIS — R7303 Prediabetes: Secondary | ICD-10-CM

## 2023-08-11 DIAGNOSIS — E66811 Obesity, class 1: Secondary | ICD-10-CM

## 2023-08-11 DIAGNOSIS — I10 Essential (primary) hypertension: Secondary | ICD-10-CM | POA: Diagnosis not present

## 2023-08-11 DIAGNOSIS — Z6833 Body mass index (BMI) 33.0-33.9, adult: Secondary | ICD-10-CM | POA: Diagnosis not present

## 2023-08-11 DIAGNOSIS — E782 Mixed hyperlipidemia: Secondary | ICD-10-CM | POA: Diagnosis not present

## 2023-08-11 DIAGNOSIS — E6609 Other obesity due to excess calories: Secondary | ICD-10-CM

## 2023-08-11 MED ORDER — OLMESARTAN MEDOXOMIL-HCTZ 20-12.5 MG PO TABS
1.0000 | ORAL_TABLET | Freq: Every day | ORAL | 1 refills | Status: DC
  Filled 2023-08-11 – 2023-08-25 (×2): qty 90, 90d supply, fill #0
  Filled 2023-11-23: qty 90, 90d supply, fill #1

## 2023-08-11 NOTE — Patient Instructions (Signed)
 Hypertension, Adult Hypertension is another name for high blood pressure. High blood pressure forces your heart to work harder to pump blood. This can cause problems over time. There are two numbers in a blood pressure reading. There is a top number (systolic) over a bottom number (diastolic). It is best to have a blood pressure that is below 120/80. What are the causes? The cause of this condition is not known. Some other conditions can lead to high blood pressure. What increases the risk? Some lifestyle factors can make you more likely to develop high blood pressure: Smoking. Not getting enough exercise or physical activity. Being overweight. Having too much fat, sugar, calories, or salt (sodium) in your diet. Drinking too much alcohol. Other risk factors include: Having any of these conditions: Heart disease. Diabetes. High cholesterol. Kidney disease. Obstructive sleep apnea. Having a family history of high blood pressure and high cholesterol. Age. The risk increases with age. Stress. What are the signs or symptoms? High blood pressure may not cause symptoms. Very high blood pressure (hypertensive crisis) may cause: Headache. Fast or uneven heartbeats (palpitations). Shortness of breath. Nosebleed. Vomiting or feeling like you may vomit (nauseous). Changes in how you see. Very bad chest pain. Feeling dizzy. Seizures. How is this treated? This condition is treated by making healthy lifestyle changes, such as: Eating healthy foods. Exercising more. Drinking less alcohol. Your doctor may prescribe medicine if lifestyle changes do not help enough and if: Your top number is above 130. Your bottom number is above 80. Your personal target blood pressure may vary. Follow these instructions at home: Eating and drinking  If told, follow the DASH eating plan. To follow this plan: Fill one half of your plate at each meal with fruits and vegetables. Fill one fourth of your plate  at each meal with whole grains. Whole grains include whole-wheat pasta, brown rice, and whole-grain bread. Eat or drink low-fat dairy products, such as skim milk or low-fat yogurt. Fill one fourth of your plate at each meal with low-fat (lean) proteins. Low-fat proteins include fish, chicken without skin, eggs, beans, and tofu. Avoid fatty meat, cured and processed meat, or chicken with skin. Avoid pre-made or processed food. Limit the amount of salt in your diet to less than 1,500 mg each day. Do not drink alcohol if: Your doctor tells you not to drink. You are pregnant, may be pregnant, or are planning to become pregnant. If you drink alcohol: Limit how much you have to: 0-1 drink a day for women. 0-2 drinks a day for men. Know how much alcohol is in your drink. In the [place] one drink equals one 12 oz bottle of beer (355 mL), one 5 oz glass of wine (148 mL), or one 1 oz glass of hard liquor (44 mL). Lifestyle  Work with your doctor to stay at a healthy weight or to lose weight. Ask your doctor what the best weight is for you. Get at least 30 minutes of exercise that causes your heart to beat faster (aerobic exercise) most days of the week. This may include walking, swimming, or biking. Get at least 30 minutes of exercise that strengthens your muscles (resistance exercise) at least 3 days a week. This may include lifting weights or doing Pilates. Do not smoke or use any products that contain nicotine or tobacco. If you need help quitting, ask your doctor. Check your blood pressure at home as told by your doctor. Keep all follow-up visits. Medicines Take over-the-counter and prescription medicines  only as told by your doctor. Follow directions carefully. Do not skip doses of blood pressure medicine. The medicine does not work as well if you skip doses. Skipping doses also puts you at risk for problems. Ask your doctor about side effects or reactions to medicines that you should watch  for. Contact a doctor if: You think you are having a reaction to the medicine you are taking. You have headaches that keep coming back. You feel dizzy. You have swelling in your ankles. You have trouble with your vision. Get help right away if: You get a very bad headache. You start to feel mixed up (confused). You feel weak or numb. You feel faint. You have very bad pain in your: Chest. Belly (abdomen). You vomit more than once. You have trouble breathing. These symptoms may be an emergency. Get help right away. Call 911. Do not wait to see if the symptoms will go away. Do not drive yourself to the hospital. Summary Hypertension is another name for high blood pressure. High blood pressure forces your heart to work harder to pump blood. For most people, a normal blood pressure is less than 120/80. Making healthy choices can help lower blood pressure. If your blood pressure does not get lower with healthy choices, you may need to take medicine. This information is not intended to replace advice given to you by your health care provider. Make sure you discuss any questions you have with your health care provider. Document Revised: 03/14/2021 Document Reviewed: 03/14/2021 Elsevier Patient Education  2024 [employer].

## 2023-08-11 NOTE — Progress Notes (Signed)
 I,Jameka J Llittleton, CMA,acting as a Neurosurgeon for [employer], FNP.,have documented all relevant documentation on the behalf of Arnette Felts, FNP,as directed by  Arnette Felts, FNP while in the presence of Arnette Felts, FNP.  Subjective:  Patient ID: Erin Bowers , female    DOB: 22-Jan-1966 , 58 y.o.   MRN: [id number]  Chief Complaint  Patient presents with   Hypertension    HPI  Patient presents today for a bp check. Patient reports compliance with her meds. Patient denies having chest pain,sob or headaches at this time. Patient doesn't have any questions or concerns at this time.  Blood pressure at home 133/88 or when laying down 112/76. She does have a slight headache. She has been cutting back. She is drinking water but maybe not enough. Denies problems sleeping. She does not feel like she is stressed. She has been trying to get in exercise slowly maybe about 2 times a week. She was taking a magnesium supplement. She is eating more Smart[employer]corn.   Hypertension This is a chronic problem. The current episode started more than 1 year ago. The problem is unchanged. The problem is controlled. Pertinent negatives include no anxiety, chest pain, headaches or palpitations. There are no associated agents to hypertension. Risk factors for coronary artery disease include obesity and sedentary lifestyle. Past treatments include diuretics and angiotensin blockers. The current treatment provides significant improvement. There are no compliance problems.  There is no history of angina. There is no history of chronic renal disease.    Past Medical History:  Diagnosis Date   Blood transfusion without reported diagnosis     15 years ago   Breast cancer (HCC)    Heart murmur    Hypertension    Personal history of radiation therapy 2017     Family History  Problem Relation Age of Onset   Stroke Mother    Hypertension Mother    Stroke Father    Colon cancer Neg Hx    Esophageal cancer Neg Hx     Rectal cancer Neg Hx    Stomach cancer Neg Hx      Current Outpatient Medications:    Ascorbic Acid (VITAMIN C) 1000 MG tablet, Take 1,000 mg by mouth daily., Disp: , Rfl:    Multiple Vitamin (MULTIVITAMIN) tablet, Take 1 tablet by mouth daily., Disp: , Rfl:    Omega-3 Fatty Acids (FISH OIL) 1000 MG CAPS, Take by mouth., Disp: , Rfl:    olmesartan-hydrochlorothiazide (BENICAR HCT) 20-12.5 MG tablet, Take 1 tablet by mouth daily., Disp: 90 tablet, Rfl: 1   Allergies  Allergen Reactions   Adhesive [Tape] Other (See Comments)    Steri-strips cause blisters      Review of Systems  Constitutional: Negative.   Eyes: Negative.   Cardiovascular:  Negative for chest pain and palpitations.  Musculoskeletal: Negative.   Skin: Negative.   Neurological:  Negative for headaches.  Psychiatric/Behavioral: Negative.       Today's Vitals   08/11/23 0954 08/11/23 1028  BP: (!) 140/100 (!) 140/90  Pulse: 65   Temp: 99.3 F (37.4 C)   TempSrc: Oral   Weight: 191 lb (86.6 kg)   Height: 5\' 3"  (1.6 m)   PainSc: 0-No pain    Body mass index is 33.83 kg/m.  Wt Readings from Last 3 Encounters:  08/11/23 191 lb (86.6 kg)  02/11/23 192 lb 6.4 oz (87.3 kg)  06/11/22 190 lb (86.2 kg)     Objective:  Physical Exam Vitals and  nursing note reviewed.  Constitutional:      General: She is not in acute distress.    Appearance: Normal appearance. She is obese.  HENT:     Head: Normocephalic.  Eyes:     Extraocular Movements: Extraocular movements intact.     Pupils: Pupils are equal, round, and reactive to light.  Cardiovascular:     Rate and Rhythm: Normal rate and regular rhythm.     Pulses: Normal pulses.     Heart sounds: Normal heart sounds. No murmur heard. Pulmonary:     Effort: Pulmonary effort is normal. No respiratory distress.     Breath sounds: Normal breath sounds. No wheezing.  Skin:    General: Skin is warm and dry.     Capillary Refill: Capillary refill takes less than 2  seconds.  Neurological:     General: No focal deficit present.     Mental Status: She is alert and oriented to person, place, and time.     Cranial Nerves: No cranial nerve deficit.     Motor: No weakness.  Psychiatric:        Mood and Affect: Mood normal.        Behavior: Behavior normal.        Thought Content: Thought content normal.        Judgment: Judgment normal.         Assessment And Plan:  Essential hypertension Assessment & Plan: Blood pressure is fairly controlled.  Blood pressure is slightly improved with second check.  She is going to focus on lifestyle modifications. If persist we may need to add another medications. Continue focusing on lifestyle changes. She will return in 4 weeks for BP NV check  Orders: -     CMP14+EGFR -     Olmesartan Medoxomil-HCTZ; Take 1 tablet by mouth daily.  Dispense: 90 tablet; Refill: 1  Mixed hyperlipidemia Assessment & Plan: Cholesterol levels are stable.  Will check lipid panel.  Continue to focus on low-fat diet.  Orders: -     Lipid panel  Prediabetes Assessment & Plan: Hemoglobin A1c is stable.  Encouraged to continue limiting intake of sugary foods and drinks.  Orders: -     Hemoglobin A1c  COVID-19 vaccination declined Assessment & Plan: Declines covid 19 vaccine. Discussed risk of covid 3 and if she changes her mind about the vaccine to call the office. Education has been provided regarding the importance of this vaccine but patient still declined. Advised may receive this vaccine at local pharmacy or Health Dept.or vaccine clinic. Aware to provide a copy of the vaccination record if obtained from local pharmacy or Health Dept.  Encouraged to take multivitamin, vitamin d, vitamin c and zinc to increase immune system. Aware can call office if would like to have vaccine here at office. Verbalized acceptance and understanding.     Class 1 obesity due to excess calories with serious comorbidity and body mass index (BMI)  of 33.0 to 33.9 in adult Assessment & Plan: She is encouraged to strive for BMI less than 30 to decrease cardiac risk. Advised to aim for at least 150 minutes of exercise per week.    Essential hypertension Assessment & Plan: Blood pressure is fairly controlled.  Blood pressure is slightly improved with second check.  She is going to focus on lifestyle modifications. If persist we may need to add another medications. Continue focusing on lifestyle changes. She will return in 4 weeks for BP NV check  Orders: -  CMP14+EGFR -     Olmesartan Medoxomil-HCTZ; Take 1 tablet by mouth daily.  Dispense: 90 tablet; Refill: 1  Other long term (current) drug therapy -     CBC    Return in about 4 months (around 12/11/2023) for bpc; 4 week NV BP check.  Patient was given opportunity to ask questions. Patient verbalized understanding of the plan and was able to repeat key elements of the plan. All questions were answered to their satisfaction.    Jeanell Sparrow, FNP, have reviewed all documentation for this visit. The documentation on 08/11/23 for the exam, diagnosis, procedures, and orders are all accurate and complete.   IF YOU HAVE BEEN REFERRED TO A SPECIALIST, IT MAY TAKE 1-2 WEEKS TO SCHEDULE/PROCESS THE REFERRAL. IF YOU HAVE NOT HEARD FROM US/SPECIALIST IN TWO WEEKS, PLEASE GIVE [place] A CALL AT [phone].

## 2023-08-12 LAB — LIPID PANEL
Chol/HDL Ratio: 3.6 ratio (ref 0.0–4.4)
Cholesterol, Total: 231 mg/dL — ABNORMAL HIGH (ref 100–199)
HDL: 65 mg/dL (ref >39)
LDL Chol Calc (NIH): 154 mg/dL — ABNORMAL HIGH (ref 0–99)
Triglycerides: 69 mg/dL (ref 0–149)
VLDL Cholesterol Cal: 12 mg/dL (ref 5–40)

## 2023-08-12 LAB — HEMOGLOBIN A1C
Est. average glucose Bld gHb Est-mCnc: 120 mg/dL
Hgb A1c MFr Bld: 5.8 % — ABNORMAL HIGH (ref 4.8–5.6)

## 2023-08-12 LAB — CBC
Hematocrit: 40.9 % (ref 34.0–46.6)
Hemoglobin: 13.4 g/dL (ref 11.1–15.9)
MCH: 26.9 pg (ref 26.6–33.0)
MCHC: 32.8 g/dL (ref 31.5–35.7)
MCV: 82 fL (ref 79–97)
Platelets: 320 10*3/uL (ref 150–450)
RBC: 4.99 x10E6/uL (ref 3.77–5.28)
RDW: 13.8 % (ref 11.7–15.4)
WBC: 4.7 10*3/uL (ref 3.4–10.8)

## 2023-08-12 LAB — CMP14+EGFR
ALT: 22 IU/L (ref 0–32)
AST: 25 IU/L (ref 0–40)
Albumin: 4.6 g/dL (ref 3.8–4.9)
Alkaline Phosphatase: 100 IU/L (ref 44–121)
BUN/Creatinine Ratio: 15 (ref 9–23)
BUN: 10 mg/dL (ref 6–24)
Bilirubin Total: 0.3 mg/dL (ref 0.0–1.2)
CO2: 23 mmol/L (ref 20–29)
Calcium: 10.4 mg/dL — ABNORMAL HIGH (ref 8.7–10.2)
Chloride: 101 mmol/L (ref 96–106)
Creatinine, Ser: 0.67 mg/dL (ref 0.57–1.00)
Globulin, Total: 2.8 g/dL (ref 1.5–4.5)
Glucose: 81 mg/dL (ref 70–99)
Potassium: 4 mmol/L (ref 3.5–5.2)
Sodium: 141 mmol/L (ref 134–144)
Total Protein: 7.4 g/dL (ref 6.0–8.5)
eGFR: 103 mL/min/{1.73_m2} (ref >59)

## 2023-08-17 ENCOUNTER — Encounter: Payer: Self-pay | Admitting: Nurse Practitioner

## 2023-08-21 ENCOUNTER — Other Ambulatory Visit (HOSPITAL_COMMUNITY): Payer: Self-pay

## 2023-08-21 DIAGNOSIS — Z2821 Immunization not carried out because of patient refusal: Secondary | ICD-10-CM | POA: Insufficient documentation

## 2023-08-21 DIAGNOSIS — E66811 Obesity, class 1: Secondary | ICD-10-CM | POA: Insufficient documentation

## 2023-08-21 NOTE — Assessment & Plan Note (Signed)
 She is encouraged to strive for BMI less than 30 to decrease cardiac risk. Advised to aim for at least 150 minutes of exercise per week.

## 2023-08-21 NOTE — Assessment & Plan Note (Signed)
Cholesterol levels are stable.  Will check lipid panel.  Continue to focus on low-fat diet.

## 2023-08-21 NOTE — Assessment & Plan Note (Addendum)
 Blood pressure is fairly controlled.  Blood pressure is slightly improved with second check.  She is going to focus on lifestyle modifications. If persist we may need to add another medications. Continue focusing on lifestyle changes. She will return in 4 weeks for BP NV check

## 2023-08-21 NOTE — Assessment & Plan Note (Signed)
Hemoglobin A1c is stable.  Encouraged to continue limiting intake of sugary foods and drinks.

## 2023-08-21 NOTE — Assessment & Plan Note (Signed)

## 2023-08-25 ENCOUNTER — Other Ambulatory Visit (HOSPITAL_COMMUNITY): Payer: Self-pay

## 2023-08-26 ENCOUNTER — Ambulatory Visit
Admission: RE | Admit: 2023-08-26 | Discharge: 2023-08-26 | Disposition: A | Discharge status: Home / Self Care | Payer: Commercial Managed Care - PPO | Source: Ambulatory Visit | Attending: Nurse Practitioner | Admitting: Nurse Practitioner

## 2023-08-26 DIAGNOSIS — Z1231 Encounter for screening mammogram for malignant neoplasm of breast: Secondary | ICD-10-CM

## 2023-08-31 ENCOUNTER — Other Ambulatory Visit: Payer: Self-pay | Admitting: Nurse Practitioner

## 2023-08-31 DIAGNOSIS — R928 Other abnormal and inconclusive findings on diagnostic imaging of breast: Secondary | ICD-10-CM

## 2023-09-04 DIAGNOSIS — H524 Presbyopia: Secondary | ICD-10-CM | POA: Diagnosis not present

## 2023-09-08 ENCOUNTER — Ambulatory Visit

## 2023-09-08 VITALS — BP 130/94 | Temp 98.1°F | Ht 63.0 in | Wt 191.0 lb

## 2023-09-08 DIAGNOSIS — I10 Essential (primary) hypertension: Secondary | ICD-10-CM

## 2023-09-08 NOTE — Progress Notes (Signed)
 Patient presents today for bpc. She currently takes Olmesartan-hydrochlorothiazide 20-12.5MG  in the mornings. She did take medication before coming in today. Denies headache, chest pain & sob. She does not regularly exercise. She drinks plentiful amounts of water. Initial bp: 150/90. Bp taken again after 10 minutes:  BP Readings from Last 3 Encounters:  09/08/23 (!) 130/94  08/11/23 (!) 140/90  02/11/23 130/80  Per provider patient is to continue with current regimen. Followed up scheduled.

## 2023-09-08 NOTE — Patient Instructions (Signed)
 Hypertension, Adult Hypertension is another name for high blood pressure. High blood pressure forces your heart to work harder to pump blood. This can cause problems over time. There are two numbers in a blood pressure reading. There is a top number (systolic) over a bottom number (diastolic). It is best to have a blood pressure that is below 120/80. What are the causes? The cause of this condition is not known. Some other conditions can lead to high blood pressure. What increases the risk? Some lifestyle factors can make you more likely to develop high blood pressure: Smoking. Not getting enough exercise or physical activity. Being overweight. Having too much fat, sugar, calories, or salt (sodium) in your diet. Drinking too much alcohol. Other risk factors include: Having any of these conditions: Heart disease. Diabetes. High cholesterol. Kidney disease. Obstructive sleep apnea. Having a family history of high blood pressure and high cholesterol. Age. The risk increases with age. Stress. What are the signs or symptoms? High blood pressure may not cause symptoms. Very high blood pressure (hypertensive crisis) may cause: Headache. Fast or uneven heartbeats (palpitations). Shortness of breath. Nosebleed. Vomiting or feeling like you may vomit (nauseous). Changes in how you see. Very bad chest pain. Feeling dizzy. Seizures. How is this treated? This condition is treated by making healthy lifestyle changes, such as: Eating healthy foods. Exercising more. Drinking less alcohol. Your doctor may prescribe medicine if lifestyle changes do not help enough and if: Your top number is above 130. Your bottom number is above 80. Your personal target blood pressure may vary. Follow these instructions at home: Eating and drinking  If told, follow the DASH eating plan. To follow this plan: Fill one half of your plate at each meal with fruits and vegetables. Fill one fourth of your plate  at each meal with whole grains. Whole grains include whole-wheat pasta, brown rice, and whole-grain bread. Eat or drink low-fat dairy products, such as skim milk or low-fat yogurt. Fill one fourth of your plate at each meal with low-fat (lean) proteins. Low-fat proteins include fish, chicken without skin, eggs, beans, and tofu. Avoid fatty meat, cured and processed meat, or chicken with skin. Avoid pre-made or processed food. Limit the amount of salt in your diet to less than 1,500 mg each day. Do not drink alcohol if: Your doctor tells you not to drink. You are pregnant, may be pregnant, or are planning to become pregnant. If you drink alcohol: Limit how much you have to: 0-1 drink a day for women. 0-2 drinks a day for men. Know how much alcohol is in your drink. In the [place] one drink equals one 12 oz bottle of beer (355 mL), one 5 oz glass of wine (148 mL), or one 1 oz glass of hard liquor (44 mL). Lifestyle  Work with your doctor to stay at a healthy weight or to lose weight. Ask your doctor what the best weight is for you. Get at least 30 minutes of exercise that causes your heart to beat faster (aerobic exercise) most days of the week. This may include walking, swimming, or biking. Get at least 30 minutes of exercise that strengthens your muscles (resistance exercise) at least 3 days a week. This may include lifting weights or doing Pilates. Do not smoke or use any products that contain nicotine or tobacco. If you need help quitting, ask your doctor. Check your blood pressure at home as told by your doctor. Keep all follow-up visits. Medicines Take over-the-counter and prescription medicines  only as told by your doctor. Follow directions carefully. Do not skip doses of blood pressure medicine. The medicine does not work as well if you skip doses. Skipping doses also puts you at risk for problems. Ask your doctor about side effects or reactions to medicines that you should watch  for. Contact a doctor if: You think you are having a reaction to the medicine you are taking. You have headaches that keep coming back. You feel dizzy. You have swelling in your ankles. You have trouble with your vision. Get help right away if: You get a very bad headache. You start to feel mixed up (confused). You feel weak or numb. You feel faint. You have very bad pain in your: Chest. Belly (abdomen). You vomit more than once. You have trouble breathing. These symptoms may be an emergency. Get help right away. Call 911. Do not wait to see if the symptoms will go away. Do not drive yourself to the hospital. Summary Hypertension is another name for high blood pressure. High blood pressure forces your heart to work harder to pump blood. For most people, a normal blood pressure is less than 120/80. Making healthy choices can help lower blood pressure. If your blood pressure does not get lower with healthy choices, you may need to take medicine. This information is not intended to replace advice given to you by your health care provider. Make sure you discuss any questions you have with your health care provider. Document Revised: 03/14/2021 Document Reviewed: 03/14/2021 Elsevier Patient Education  2024 [employer].

## 2023-09-15 ENCOUNTER — Ambulatory Visit

## 2023-09-15 ENCOUNTER — Ambulatory Visit
Admission: RE | Admit: 2023-09-15 | Discharge: 2023-09-15 | Disposition: A | Discharge status: Home / Self Care | Source: Ambulatory Visit | Attending: Nurse Practitioner | Admitting: Nurse Practitioner

## 2023-09-15 DIAGNOSIS — R928 Other abnormal and inconclusive findings on diagnostic imaging of breast: Secondary | ICD-10-CM | POA: Diagnosis not present

## 2023-10-13 ENCOUNTER — Other Ambulatory Visit (HOSPITAL_COMMUNITY): Payer: Self-pay

## 2023-10-13 ENCOUNTER — Other Ambulatory Visit: Payer: Self-pay

## 2023-10-13 ENCOUNTER — Ambulatory Visit

## 2023-10-13 VITALS — BP 130/84 | HR 85 | Temp 98.5°F | Ht 63.0 in | Wt 191.0 lb

## 2023-10-13 DIAGNOSIS — I10 Essential (primary) hypertension: Secondary | ICD-10-CM

## 2023-10-13 MED ORDER — AMLODIPINE BESYLATE 2.5 MG PO TABS
2.5000 mg | ORAL_TABLET | Freq: Every day | ORAL | 11 refills | Status: AC
  Filled 2023-10-13: qty 30, 30d supply, fill #0
  Filled 2023-11-16: qty 30, 30d supply, fill #1
  Filled 2023-12-14: qty 30, 30d supply, fill #2
  Filled 2024-01-17: qty 30, 30d supply, fill #3
  Filled 2024-02-15: qty 30, 30d supply, fill #4
  Filled 2024-02-26 – 2024-03-16 (×2): qty 30, 30d supply, fill #5
  Filled 2024-04-17: qty 30, 30d supply, fill #6
  Filled 2024-05-18: qty 30, 30d supply, fill #7
  Filled 2024-06-20: qty 30, 30d supply, fill #8

## 2023-10-13 NOTE — Progress Notes (Signed)
 Patient presents today for a bp check, patient currently taking olmestartan-hydrochlorothiazide  20-12.5mg  AM.  BP Readings from Last 3 Encounters:  10/13/23 (!) 130/94  09/08/23 (!) 130/94  08/11/23 (!) 140/90   Per provider RS- start amLODipine 2.5mg  PM- aim for 150 minutes of exercise. Follow up next visit.

## 2023-12-10 ENCOUNTER — Ambulatory Visit: Payer: Self-pay | Admitting: Nurse Practitioner

## 2023-12-10 ENCOUNTER — Encounter: Payer: Self-pay | Admitting: Nurse Practitioner

## 2023-12-10 VITALS — BP 130/80 | HR 84 | Temp 98.7°F | Ht 63.0 in | Wt 195.0 lb

## 2023-12-10 DIAGNOSIS — Z2821 Immunization not carried out because of patient refusal: Secondary | ICD-10-CM | POA: Diagnosis not present

## 2023-12-10 DIAGNOSIS — I10 Essential (primary) hypertension: Secondary | ICD-10-CM | POA: Diagnosis not present

## 2023-12-10 DIAGNOSIS — R7303 Prediabetes: Secondary | ICD-10-CM

## 2023-12-10 DIAGNOSIS — E782 Mixed hyperlipidemia: Secondary | ICD-10-CM

## 2023-12-10 DIAGNOSIS — E66811 Obesity, class 1: Secondary | ICD-10-CM | POA: Diagnosis not present

## 2023-12-10 DIAGNOSIS — E6609 Other obesity due to excess calories: Secondary | ICD-10-CM

## 2023-12-10 DIAGNOSIS — Z6834 Body mass index (BMI) 34.0-34.9, adult: Secondary | ICD-10-CM | POA: Diagnosis not present

## 2023-12-10 NOTE — Progress Notes (Signed)
 LILLETTE Kristeen JINNY Gladis, CMA,acting as a Neurosurgeon for Erin Ada, FNP.,have documented all relevant documentation on the behalf of Erin Ada, FNP,as directed by  Erin Ada, FNP while in the presence of Erin Ada, FNP.  Subjective:  Patient ID: Erin Bowers , female    DOB: [birth year] , 58 y.o.   MRN: [id number]  Chief Complaint  Patient presents with   Hypertension    Patient presents today for a bp and chol follow up, Patient reports compliance with medication. Patient denies any chest pain, SOB, or headaches. Patient has no concerns today.     HPI  Patient presents today for a bp check. Patient reports compliance with her meds. Patient denies having chest pain,sob or headaches at this time. Her blood pressure at home was 120/74 at work prior to coming in. She is exercising slowly. She works in supply at the hospital. She is working on her diet as well.   Hypertension This is a chronic problem. The current episode started more than 1 year ago. The problem is unchanged. The problem is controlled. Pertinent negatives include no anxiety, chest pain, headaches or palpitations. There are no associated agents to hypertension. Risk factors for coronary artery disease include obesity and sedentary lifestyle. Past treatments include diuretics and angiotensin blockers. The current treatment provides significant improvement. There are no compliance problems.  There is no history of angina. There is no history of chronic renal disease.     Past Medical History:  Diagnosis Date   Blood transfusion without reported diagnosis     15 years ago   Breast cancer (HCC)    Heart murmur    Hypertension    Personal history of radiation therapy 2017     Family History  Problem Relation Age of Onset   Stroke Mother    Hypertension Mother    Stroke Father    Colon cancer Neg Hx    Esophageal cancer Neg Hx    Rectal cancer Neg Hx    Stomach cancer Neg Hx      Current Outpatient Medications:     amLODipine  (NORVASC ) 2.5 MG tablet, Take 1 tablet (2.5 mg total) by mouth daily., Disp: 30 tablet, Rfl: 11   Ascorbic Acid (VITAMIN C) 1000 MG tablet, Take 1,000 mg by mouth daily., Disp: , Rfl:    Multiple Vitamin (MULTIVITAMIN) tablet, Take 1 tablet by mouth daily., Disp: , Rfl:    olmesartan -hydrochlorothiazide  (BENICAR  HCT) 20-12.5 MG tablet, Take 1 tablet by mouth daily., Disp: 90 tablet, Rfl: 1   Omega-3 Fatty Acids (FISH OIL) 1000 MG CAPS, Take by mouth., Disp: , Rfl:    Allergies  Allergen Reactions   Adhesive [Tape] Other (See Comments)    Steri-strips cause blisters      Review of Systems  Constitutional: Negative.   Respiratory: Negative.    Cardiovascular:  Negative for chest pain, palpitations and leg swelling.  Neurological:  Negative for headaches.     Today's Vitals   12/10/23 1606  BP: 130/80  Pulse: 84  Temp: 98.7 F (37.1 C)  TempSrc: Oral  Weight: 195 lb (88.5 kg)  Height: 5' 3 (1.6 m)  PainSc: 0-No pain   Body mass index is 34.54 kg/m.  Wt Readings from Last 3 Encounters:  12/10/23 195 lb (88.5 kg)  10/13/23 191 lb (86.6 kg)  09/08/23 191 lb (86.6 kg)     Objective:  Physical Exam Vitals and nursing note reviewed.  Constitutional:      General: She is not  in acute distress.    Appearance: Normal appearance. She is obese.  HENT:     Head: Normocephalic.  Eyes:     Extraocular Movements: Extraocular movements intact.     Pupils: Pupils are equal, round, and reactive to light.  Cardiovascular:     Rate and Rhythm: Normal rate and regular rhythm.     Pulses: Normal pulses.     Heart sounds: Normal heart sounds. No murmur heard. Pulmonary:     Effort: Pulmonary effort is normal. No respiratory distress.     Breath sounds: Normal breath sounds. No wheezing.  Skin:    General: Skin is warm and dry.     Capillary Refill: Capillary refill takes less than 2 seconds.  Neurological:     General: No focal deficit present.     Mental Status: She  is alert and oriented to person, place, and time.     Cranial Nerves: No cranial nerve deficit.     Motor: No weakness.  Psychiatric:        Mood and Affect: Mood normal.        Behavior: Behavior normal.        Thought Content: Thought content normal.        Judgment: Judgment normal.         Assessment And Plan:  Essential hypertension Assessment & Plan: Blood pressure is controlled, continue current medications   Mixed hyperlipidemia Assessment & Plan: Cholesterol levels are stable.  Will check lipid panel.  Continue to focus on low-fat diet.   Prediabetes Assessment & Plan: Hemoglobin A1c is stable.  Encouraged to continue limiting intake of sugary foods and drinks.   COVID-19 vaccination declined Assessment & Plan: Declines covid 19 vaccine. Discussed risk of covid 46 and if she changes her mind about the vaccine to call the office. Education has been provided regarding the importance of this vaccine but patient still declined. Advised may receive this vaccine at local pharmacy or Health Dept.or vaccine clinic. Aware to provide a copy of the vaccination record if obtained from local pharmacy or Health Dept.  Encouraged to take multivitamin, vitamin d, vitamin c and zinc to increase immune system. Aware can call office if would like to have vaccine here at office. Verbalized acceptance and understanding.     Class 1 obesity due to excess calories with body mass index (BMI) of 34.0 to 34.9 in adult, unspecified whether serious comorbidity present Assessment & Plan: She is encouraged to strive for BMI less than 30 to decrease cardiac risk. Advised to aim for at least 150 minutes of exercise per week.        Return for keep same next.  Patient was given opportunity to ask questions. Patient verbalized understanding of the plan and was able to repeat key elements of the plan. All questions were answered to their satisfaction.    LILLETTE Erin Ada, FNP, have reviewed all  documentation for this visit. The documentation on 12/10/23 for the exam, diagnosis, procedures, and orders are all accurate and complete.   IF YOU HAVE BEEN REFERRED TO A SPECIALIST, IT MAY TAKE 1-2 WEEKS TO SCHEDULE/PROCESS THE REFERRAL. IF YOU HAVE NOT HEARD FROM US /SPECIALIST IN TWO WEEKS, PLEASE GIVE US  A CALL AT [phone].

## 2023-12-21 ENCOUNTER — Encounter: Payer: Self-pay | Admitting: Nurse Practitioner

## 2023-12-21 NOTE — Assessment & Plan Note (Signed)
 She is encouraged to strive for BMI less than 30 to decrease cardiac risk. Advised to aim for at least 150 minutes of exercise per week.

## 2023-12-21 NOTE — Assessment & Plan Note (Signed)
Hemoglobin A1c is stable.  Encouraged to continue limiting intake of sugary foods and drinks.

## 2023-12-21 NOTE — Assessment & Plan Note (Signed)
Cholesterol levels are stable.  Will check lipid panel.  Continue to focus on low-fat diet.

## 2023-12-21 NOTE — Assessment & Plan Note (Signed)
 Blood pressure is controlled, continue current medications

## 2023-12-21 NOTE — Assessment & Plan Note (Signed)

## 2024-02-15 NOTE — Progress Notes (Signed)
 LILLETTE Kristeen JINNY Gladis, CMA,acting as a Neurosurgeon for [name], FNP.,have documented all relevant documentation on the behalf of [name], FNP,as directed by  [name], FNP while in the presence of [name], FNP.  Subjective:    Patient ID: Erin Bowers , female    DOB: 29-Apr-1966 , 58 y.o.   MRN: [id number]  No chief complaint on file.   HPI  HPI   Past Medical History:  Diagnosis Date   Blood transfusion without reported diagnosis     15 years ago   Breast cancer (HCC)    Heart murmur    Hypertension    Personal history of radiation therapy 2017     Family History  Problem Relation Age of Onset   Stroke Mother    Hypertension Mother    Stroke Father    Colon cancer Neg Hx    Esophageal cancer Neg Hx    Rectal cancer Neg Hx    Stomach cancer Neg Hx      Current Outpatient Medications:    amLODipine  (NORVASC ) 2.5 MG tablet, Take 1 tablet (2.5 mg total) by mouth daily., Disp: 30 tablet, Rfl: 11   Ascorbic Acid (VITAMIN C) 1000 MG tablet, Take 1,000 mg by mouth daily., Disp: , Rfl:    Multiple Vitamin (MULTIVITAMIN) tablet, Take 1 tablet by mouth daily., Disp: , Rfl:    olmesartan -hydrochlorothiazide  (BENICAR  HCT) 20-12.5 MG tablet, Take 1 tablet by mouth daily., Disp: 90 tablet, Rfl: 1   Omega-3 Fatty Acids (FISH OIL) 1000 MG CAPS, Take by mouth., Disp: , Rfl:    Allergies  Allergen Reactions   Adhesive [Tape] Other (See Comments)    Steri-strips cause blisters       The patient states she uses {contraceptive methods:5051} for birth control. No LMP recorded. Patient has had a hysterectomy.. {Dysmenorrhea-menorrhagia:21918}. Negative for: breast discharge, breast lump(s), breast pain and breast self exam. Associated symptoms include abnormal vaginal bleeding. Pertinent negatives include abnormal bleeding (hematology), anxiety, decreased libido, depression, difficulty falling sleep, dyspareunia, history of infertility, nocturia, sexual dysfunction, sleep  disturbances, urinary incontinence, urinary urgency, vaginal discharge and vaginal itching. Diet regular.The patient states her exercise level is    . The patient's tobacco use is:  Social History   Tobacco Use  Smoking Status Never  Smokeless Tobacco Never  . She has been exposed to passive smoke. The patient's alcohol use is:  Social History   Substance and Sexual Activity  Alcohol Use No  . Additional information: Last pap ***, next one scheduled for ***.    Review of Systems   There were no vitals filed for this visit. There is no height or weight on file to calculate BMI.  Wt Readings from Last 3 Encounters:  12/10/23 195 lb (88.5 kg)  10/13/23 191 lb (86.6 kg)  09/08/23 191 lb (86.6 kg)     Objective:  Physical Exam      Assessment And Plan:     Encounter for annual health examination  Essential hypertension  Mixed hyperlipidemia  Prediabetes     No follow-ups on file. Patient was given opportunity to ask questions. Patient verbalized understanding of the plan and was able to repeat key elements of the plan. All questions were answered to their satisfaction.   [name], FNP  I, [name], FNP, have reviewed all documentation for this visit. The documentation on 02/15/24 for the exam, diagnosis, procedures, and orders are all accurate and complete.

## 2024-02-16 ENCOUNTER — Encounter: Payer: Self-pay | Admitting: Nurse Practitioner

## 2024-02-16 ENCOUNTER — Ambulatory Visit (INDEPENDENT_AMBULATORY_CARE_PROVIDER_SITE_OTHER): Payer: Self-pay | Admitting: Nurse Practitioner

## 2024-02-16 VITALS — BP 132/80 | Temp 98.5°F | Ht 63.0 in | Wt 195.2 lb

## 2024-02-16 DIAGNOSIS — R7303 Prediabetes: Secondary | ICD-10-CM | POA: Diagnosis not present

## 2024-02-16 DIAGNOSIS — I10 Essential (primary) hypertension: Secondary | ICD-10-CM | POA: Diagnosis not present

## 2024-02-16 DIAGNOSIS — Z0000 Encounter for general adult medical examination without abnormal findings: Secondary | ICD-10-CM

## 2024-02-16 DIAGNOSIS — E6609 Other obesity due to excess calories: Secondary | ICD-10-CM | POA: Diagnosis not present

## 2024-02-16 DIAGNOSIS — E782 Mixed hyperlipidemia: Secondary | ICD-10-CM | POA: Diagnosis not present

## 2024-02-16 DIAGNOSIS — Z79899 Other long term (current) drug therapy: Secondary | ICD-10-CM

## 2024-02-16 DIAGNOSIS — Z2821 Immunization not carried out because of patient refusal: Secondary | ICD-10-CM | POA: Diagnosis not present

## 2024-02-16 DIAGNOSIS — Z6834 Body mass index (BMI) 34.0-34.9, adult: Secondary | ICD-10-CM

## 2024-02-16 DIAGNOSIS — E66811 Obesity, class 1: Secondary | ICD-10-CM

## 2024-02-16 DIAGNOSIS — Z139 Encounter for screening, unspecified: Secondary | ICD-10-CM | POA: Diagnosis not present

## 2024-02-16 LAB — POCT URINALYSIS DIP (CLINITEK)
Bilirubin, UA: NEGATIVE
Blood, UA: NEGATIVE
Glucose, UA: NEGATIVE mg/dL
Ketones, POC UA: NEGATIVE mg/dL
Leukocytes, UA: NEGATIVE
Nitrite, UA: NEGATIVE
POC PROTEIN,UA: NEGATIVE
Spec Grav, UA: 1.01 (ref 1.010–1.025)
Urobilinogen, UA: 0.2 U/dL
pH, UA: 7 (ref 5.0–8.0)

## 2024-02-16 NOTE — Assessment & Plan Note (Signed)
 Routine wellness visit with stable weight and no acute concerns. Blood pressure to be rechecked. Post-hysterectomy, no PAPs needed unless sexually active. Improved diet noted. Limited exercise due to work schedule. Declined flu shot today, plans to receive it at work in October. - Recheck blood pressure before leaving. - Order blood work. - Update hysterectomy details in chart. - Encourage continued dietary improvements. - Encourage use of resistance bands for exercise. - Send flu shot documentation via MyChart after receiving at work.

## 2024-02-16 NOTE — Assessment & Plan Note (Signed)
 Blood pressure 140/80 mmHg today repeat is slightly improved systolic. Occasional higher readings, possibly due to dietary sodium. Continues on current antihypertensive regimen. - Continue current antihypertensive medications. - Encourage reduction of dietary sodium intake. - EKG done with NSR HR 64

## 2024-02-16 NOTE — Assessment & Plan Note (Signed)
 Erin Bowers

## 2024-02-16 NOTE — Assessment & Plan Note (Signed)
 Hemoglobin A1c is stable.  Encouraged to continue limiting intake of sugary foods and drinks and continue current medications

## 2024-02-16 NOTE — Assessment & Plan Note (Signed)
Cholesterol levels are stable.  Will check lipid panel.  Continue to focus on low-fat diet.

## 2024-02-16 NOTE — Assessment & Plan Note (Signed)
 She is encouraged to strive for BMI less than 30 to decrease cardiac risk. Advised to aim for at least 150 minutes of exercise per week.

## 2024-02-16 NOTE — Assessment & Plan Note (Signed)
 Plans to get at work in October

## 2024-02-17 LAB — CMP14+EGFR
ALT: 25 IU/L (ref 0–32)
AST: 25 IU/L (ref 0–40)
Albumin: 4.7 g/dL (ref 3.8–4.9)
Alkaline Phosphatase: 94 IU/L (ref 44–121)
BUN/Creatinine Ratio: 14 (ref 9–23)
BUN: 9 mg/dL (ref 6–24)
Bilirubin Total: 0.4 mg/dL (ref 0.0–1.2)
CO2: 24 mmol/L (ref 20–29)
Calcium: 10.3 mg/dL — ABNORMAL HIGH (ref 8.7–10.2)
Chloride: 97 mmol/L (ref 96–106)
Creatinine, Ser: 0.66 mg/dL (ref 0.57–1.00)
Globulin, Total: 2.8 g/dL (ref 1.5–4.5)
Glucose: 79 mg/dL (ref 70–99)
Potassium: 4.1 mmol/L (ref 3.5–5.2)
Sodium: 137 mmol/L (ref 134–144)
Total Protein: 7.5 g/dL (ref 6.0–8.5)
eGFR: 103 mL/min/1.73 (ref >59)

## 2024-02-17 LAB — CBC WITH DIFFERENTIAL/PLATELET
Basophils Absolute: 0 x10E3/uL (ref 0.0–0.2)
Basos: 1 %
EOS (ABSOLUTE): 0.1 x10E3/uL (ref 0.0–0.4)
Eos: 2 %
Hematocrit: 40.6 % (ref 34.0–46.6)
Hemoglobin: 12.9 g/dL (ref 11.1–15.9)
Immature Grans (Abs): 0 x10E3/uL (ref 0.0–0.1)
Immature Granulocytes: 0 %
Lymphocytes Absolute: 2.5 x10E3/uL (ref 0.7–3.1)
Lymphs: 45 %
MCH: 26.8 pg (ref 26.6–33.0)
MCHC: 31.8 g/dL (ref 31.5–35.7)
MCV: 84 fL (ref 79–97)
Monocytes Absolute: 0.5 x10E3/uL (ref 0.1–0.9)
Monocytes: 9 %
Neutrophils Absolute: 2.4 x10E3/uL (ref 1.4–7.0)
Neutrophils: 43 %
Platelets: 283 x10E3/uL (ref 150–450)
RBC: 4.82 x10E6/uL (ref 3.77–5.28)
RDW: 13.8 % (ref 11.7–15.4)
WBC: 5.5 x10E3/uL (ref 3.4–10.8)

## 2024-02-17 LAB — LIPID PANEL
Chol/HDL Ratio: 3.7 ratio (ref 0.0–4.4)
Cholesterol, Total: 223 mg/dL — ABNORMAL HIGH (ref 100–199)
HDL: 61 mg/dL (ref >39)
LDL Chol Calc (NIH): 149 mg/dL — ABNORMAL HIGH (ref 0–99)
Triglycerides: 74 mg/dL (ref 0–149)
VLDL Cholesterol Cal: 13 mg/dL (ref 5–40)

## 2024-02-17 LAB — HEMOGLOBIN A1C
Est. average glucose Bld gHb Est-mCnc: 120 mg/dL
Hgb A1c MFr Bld: 5.8 % — ABNORMAL HIGH (ref 4.8–5.6)

## 2024-02-17 LAB — MICROALBUMIN / CREATININE URINE RATIO
Creatinine, Urine: 16.3 mg/dL
Microalb/Creat Ratio: <18 mg/g{creat} (ref 0–29)
Microalbumin, Urine: <3 ug/mL

## 2024-02-17 LAB — HEPATITIS B SURFACE ANTIBODY,QUALITATIVE: Hep B Surface Ab, Qual: REACTIVE

## 2024-02-23 ENCOUNTER — Ambulatory Visit: Payer: Self-pay | Admitting: Nurse Practitioner

## 2024-02-23 LAB — LIPOPROTEIN A (LPA): Lipoprotein (a): 179 nmol/L — ABNORMAL HIGH (ref <75.0)

## 2024-02-23 LAB — SPECIMEN STATUS REPORT

## 2024-02-26 ENCOUNTER — Other Ambulatory Visit: Payer: Self-pay | Admitting: Nurse Practitioner

## 2024-02-26 ENCOUNTER — Other Ambulatory Visit (HOSPITAL_COMMUNITY): Payer: Self-pay

## 2024-02-26 DIAGNOSIS — I10 Essential (primary) hypertension: Secondary | ICD-10-CM

## 2024-02-29 ENCOUNTER — Other Ambulatory Visit: Payer: Self-pay

## 2024-02-29 ENCOUNTER — Other Ambulatory Visit (HOSPITAL_COMMUNITY): Payer: Self-pay

## 2024-02-29 MED ORDER — OLMESARTAN MEDOXOMIL-HCTZ 20-12.5 MG PO TABS
1.0000 | ORAL_TABLET | Freq: Every day | ORAL | 1 refills | Status: AC
  Filled 2024-02-29: qty 90, 90d supply, fill #0
  Filled 2024-05-22: qty 90, 90d supply, fill #1

## 2024-03-21 ENCOUNTER — Other Ambulatory Visit (HOSPITAL_COMMUNITY): Payer: Self-pay

## 2024-08-15 ENCOUNTER — Ambulatory Visit: Payer: Self-pay | Admitting: Nurse Practitioner

## 2025-02-16 ENCOUNTER — Encounter: Payer: Self-pay | Admitting: Nurse Practitioner
